# Patient Record
Sex: Female | Born: 1960 | Race: Black or African American | Hispanic: No | Marital: Married | Smoking: Never smoker
Health system: Southern US, Community
[De-identification: ages and names within clinical notes are randomized; demographics above are authoritative.]

## PROBLEM LIST (undated history)

## (undated) DIAGNOSIS — J45909 Unspecified asthma, uncomplicated: Secondary | ICD-10-CM

## (undated) DIAGNOSIS — E049 Nontoxic goiter, unspecified: Secondary | ICD-10-CM

## (undated) DIAGNOSIS — E119 Type 2 diabetes mellitus without complications: Secondary | ICD-10-CM

## (undated) DIAGNOSIS — I10 Essential (primary) hypertension: Secondary | ICD-10-CM

## (undated) DIAGNOSIS — E039 Hypothyroidism, unspecified: Secondary | ICD-10-CM

## (undated) HISTORY — DX: Hypothyroidism, unspecified: E03.9

## (undated) HISTORY — PX: OTHER SURGICAL HISTORY: SHX169

## (undated) HISTORY — DX: Nontoxic goiter, unspecified: E04.9

---

## 2015-12-16 LAB — TSH: TSH: 3.1 u[IU]/mL (ref <=5.90)

## 2016-08-17 LAB — LIPID PANEL
CHOLESTEROL: 210 mg/dL — AB (ref 0–200)
HDL: 39 mg/dL (ref 35–70)
LDL CALC: 133 mg/dL
TRIGLYCERIDES: 190 mg/dL — AB (ref 40–160)

## 2016-08-17 LAB — HEMOGLOBIN A1C: HEMOGLOBIN A1C: 7.1

## 2016-11-28 ENCOUNTER — Ambulatory Visit: Payer: Self-pay | Admitting: "Endocrinology

## 2016-12-07 ENCOUNTER — Encounter: Payer: Self-pay | Admitting: "Endocrinology

## 2016-12-07 ENCOUNTER — Ambulatory Visit (INDEPENDENT_AMBULATORY_CARE_PROVIDER_SITE_OTHER): Payer: BLUE CROSS/BLUE SHIELD | Admitting: "Endocrinology

## 2016-12-07 VITALS — BP 149/84 | HR 76 | Ht 67.0 in | Wt 242.0 lb

## 2016-12-07 DIAGNOSIS — I10 Essential (primary) hypertension: Secondary | ICD-10-CM | POA: Insufficient documentation

## 2016-12-07 DIAGNOSIS — E6609 Other obesity due to excess calories: Secondary | ICD-10-CM | POA: Diagnosis not present

## 2016-12-07 DIAGNOSIS — E118 Type 2 diabetes mellitus with unspecified complications: Secondary | ICD-10-CM | POA: Diagnosis not present

## 2016-12-07 DIAGNOSIS — E039 Hypothyroidism, unspecified: Secondary | ICD-10-CM | POA: Insufficient documentation

## 2016-12-07 DIAGNOSIS — E049 Nontoxic goiter, unspecified: Secondary | ICD-10-CM | POA: Insufficient documentation

## 2016-12-07 DIAGNOSIS — E1165 Type 2 diabetes mellitus with hyperglycemia: Secondary | ICD-10-CM | POA: Diagnosis not present

## 2016-12-07 DIAGNOSIS — Z6837 Body mass index (BMI) 37.0-37.9, adult: Secondary | ICD-10-CM | POA: Diagnosis not present

## 2016-12-07 DIAGNOSIS — IMO0001 Reserved for inherently not codable concepts without codable children: Secondary | ICD-10-CM | POA: Insufficient documentation

## 2016-12-07 DIAGNOSIS — IMO0002 Reserved for concepts with insufficient information to code with codable children: Secondary | ICD-10-CM

## 2016-12-07 MED ORDER — LEVOTHYROXINE SODIUM 50 MCG PO TABS: 50.0000 ug | ORAL_TABLET | Freq: Every day | ORAL | 2 refills | Status: DC

## 2016-12-07 MED ORDER — METFORMIN HCL 500 MG PO TABS: 500.0000 mg | ORAL_TABLET | Freq: Two times a day (BID) | ORAL | 3 refills | Status: DC

## 2016-12-07 NOTE — Patient Instructions (Signed)

## 2016-12-07 NOTE — Progress Notes (Signed)
Subjective:    Patient ID: Andrey SpearmanAlice Chew, female    DOB: 05/17/1961, PCP Barbie HaggisWILBORNE, CYNTHIA, FNP   Past Medical History:  Diagnosis Date  . Goiter   . Hypothyroidism    Past Surgical History:  Procedure Laterality Date  . Partial Thyroidiectomy     Social History   Social History  . Marital status: Married    Spouse name: N/A  . Number of children: N/A  . Years of education: N/A   Social History Main Topics  . Smoking status: Never Smoker  . Smokeless tobacco: Never Used  . Alcohol use No  . Drug use: No  . Sexual activity: Not Asked   Other Topics Concern  . None   Social History Narrative  . None   Outpatient Encounter Prescriptions as of 12/07/2016  Medication Sig  . levothyroxine (SYNTHROID, LEVOTHROID) 50 MCG tablet Take 1 tablet (50 mcg total) by mouth daily before breakfast.  . Omega-3 Fatty Acids (FISH OIL) 1000 MG CAPS Take by mouth daily.  . Red Yeast Rice Extract (RED YEAST RICE PO) Take by mouth daily.  . [DISCONTINUED] levothyroxine (SYNTHROID, LEVOTHROID) 25 MCG tablet Take 25 mcg by mouth daily before breakfast.  . metFORMIN (GLUCOPHAGE) 500 MG tablet Take 1 tablet (500 mg total) by mouth 2 (two) times daily with a meal.   No facility-administered encounter medications on file as of 12/07/2016.    ALLERGIES: Allergies  Allergen Reactions  . Penicillins   . Sugar-Protein-Starch     VACCINATION STATUS:  There is no immunization history on file for this patient.  HPI 56 year old female patient with medical history as above. She is being seen in consultation for hypothyroidism requested by Barbie HaggisWILBORNE, CYNTHIA, FNP. - Her history includes partial left hemithyroidectomy in 2000 for nodular goiter. Per her report, she did not require any thyroid hormone supplement until last year when she was initiated on low-dose levothyroxine 25 g by mouth every morning. -She reports compliance to this medication. - Currently she complains of progressive weight  gain/inability to lose weight, fatigue. - She also complains of some pressure in her neck, but denies dysphagia or shortness of breath. - She also has type 2 diabetes with recent A1c was 7.1%, not on medications. -She was found to have significantly elevated blood pressure at 149/84 , she is not on medications. 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 She is taking Red Yeast Rice from over-the-counter.  Review of Systems  Constitutional: Significantly over weight, progressively gaining, + fatigue, no subjective hyperthermia, no subjective hypothermia Eyes: no blurry vision, no xerophthalmia ENT: no sore throat, no nodules palpated in throat, no dysphagia/odynophagia, no hoarseness Cardiovascular: no Chest Pain, no Shortness of Breath, no palpitations, no leg swelling Respiratory: no cough, no SOB Gastrointestinal: no Nausea/Vomiting/Diarhhea Musculoskeletal: no muscle/joint aches Skin: no rashes Neurological: no tremors, no numbness, no tingling, no dizziness Psychiatric: no depression, no anxiety  Objective:    BP (!) 149/84   Pulse 76   Ht 5\' 7"  (1.702 m)   Wt 242 lb (109.8 kg)   BMI 37.90 kg/m   Wt Readings from Last 3 Encounters:  12/07/16 242 lb (109.8 kg)    Physical Exam  Constitutional: Significantly over weight for hight, not in acute distress, normal state of mind Eyes: PERRLA, EOMI, no exophthalmos ENT: moist mucous membranes, +  palpable asymmetric thyromegaly R>L, no cervical lymphadenopathy Cardiovascular: normal precordial activity, Regular Rate and Rhythm, no Murmur/Rubs/Gallops Respiratory:  adequate breathing efforts,  no gross chest deformity, Clear to auscultation bilaterally Gastrointestinal: +Obese,  abdomen soft, Non -tender, No distension, Bowel Sounds present Musculoskeletal: no gross deformities, strength intact in all four extremities Skin: moist, warm, no rashes Neurological: no tremor with outstretched hands,  Deep tendon reflexes normal in all four extremities.  Referral package includes labs from 08/17/2016: A1c 7.1%, TSH 3.1, free T4 0.78, total cholesterol 210, triglycerides 190, HDL 39, LDL 133   Assessment & Plan:   1. Hypothyroidism, unspecified type - Based on her body habitus and thyroid function tests from November 2017, she will benefit from slight increase in her thyroid hormone. - I will increase her levothyroxine to 50 g by mouth every morning, to advance based on her response and thyroid function test.  - We discussed about correct intake of levothyroxine, at fasting, with water, separated by at least 30 minutes from breakfast, and separated by more than 4 hours from calcium, iron, multivitamins, acid reflux medications (PPIs). -Patient is made aware of the fact that thyroid hormone replacement is needed for life, dose to be adjusted by periodic monitoring of thyroid function tests. - I would include free T4/TSH, thyroglobulin antibodies and thyroid peroxidase antibodies during her next lab work.   2. Uncontrolled type 2 diabetes mellitus with complication, without long-term current use of insulin (HCC) - This patient has significant metabolic syndrome including type 2 diabetes, dyslipidemia, hypertension, obesity, relatively sedentary life. -  She will benefit from weight loss. - I had a long discussion with her regarding the need for control of diabetes mainly by diet and exercise and the fact that she would benefit from low-dose metformin. She agrees to plan to initiate metformin 500 mg by mouth daily after breakfast. - I will include hemoglobin A1c during her next labs.   3. Nodular goiter - Physical exam is significant for asymmetrical a large thyroid right lobe  greater than left lobe. She will need baseline thyroid/neck ultrasound. If she has significant findings of nodules, she'll be considered for fine needle aspiration.  4. Hypertension: It appears that she had it for  sometime .  she is hesitant to go on medications. I advised her on salt restriction, needs follow-up.    5. Hyperlipidemia: She is hesitant to treat hyperlipidemia with statins and would like to stay on red yeast Rice.  - 25 minutes of time was spent on the care of this patient , 50% of which was applied for counseling on diabetes complications and their preventions.  - I advised patient to maintain close follow up with Barbie Haggis, FNP for primary care needs. Follow up plan: Return in about 3 months (around 03/09/2017) for follow up with pre-visit labs, Thyroid / Neck Ultrasound.  Marquis Lunch, MD Phone: [phone]  Fax: [phone]   12/07/2016, 4:19 PM

## 2017-03-02 ENCOUNTER — Other Ambulatory Visit: Payer: Self-pay | Admitting: "Endocrinology

## 2017-03-20 ENCOUNTER — Other Ambulatory Visit: Payer: Self-pay | Admitting: "Endocrinology

## 2017-03-20 LAB — COMPREHENSIVE METABOLIC PANEL
ALK PHOS: 51 U/L (ref 33–130)
ALT: 14 U/L (ref 6–29)
AST: 20 U/L (ref 10–35)
Albumin: 4.2 g/dL (ref 3.6–5.1)
BUN: 13 mg/dL (ref 7–25)
CALCIUM: 9.5 mg/dL (ref 8.6–10.4)
CHLORIDE: 103 mmol/L (ref 98–110)
CO2: 26 mmol/L (ref 20–31)
Creat: 0.87 mg/dL (ref 0.50–1.05)
GLUCOSE: 89 mg/dL (ref 65–99)
POTASSIUM: 4.4 mmol/L (ref 3.5–5.3)
Sodium: 139 mmol/L (ref 135–146)
TOTAL PROTEIN: 6.9 g/dL (ref 6.1–8.1)
Total Bilirubin: 0.4 mg/dL (ref 0.2–1.2)

## 2017-03-20 LAB — TSH: TSH: 1.12 m[IU]/L

## 2017-03-20 LAB — T4, FREE: Free T4: 1.3 ng/dL (ref 0.8–1.8)

## 2017-03-21 LAB — HEMOGLOBIN A1C
HEMOGLOBIN A1C: 5.8 % — AB (ref <5.7)
MEAN PLASMA GLUCOSE: 120 mg/dL

## 2017-03-21 LAB — THYROID PEROXIDASE ANTIBODY: THYROID PEROXIDASE ANTIBODY: 1 [IU]/mL (ref <9)

## 2017-03-21 LAB — THYROGLOBULIN ANTIBODY: Thyroglobulin Ab: <1 IU/mL (ref <2)

## 2017-03-23 ENCOUNTER — Ambulatory Visit (HOSPITAL_COMMUNITY)
Admission: RE | Admit: 2017-03-23 | Discharge: 2017-03-23 | Disposition: A | Discharge status: Home / Self Care | Payer: BLUE CROSS/BLUE SHIELD | Source: Ambulatory Visit | Attending: "Endocrinology | Admitting: "Endocrinology

## 2017-03-23 DIAGNOSIS — Z9889 Other specified postprocedural states: Secondary | ICD-10-CM | POA: Insufficient documentation

## 2017-03-23 DIAGNOSIS — E039 Hypothyroidism, unspecified: Secondary | ICD-10-CM | POA: Diagnosis not present

## 2017-03-23 DIAGNOSIS — E049 Nontoxic goiter, unspecified: Secondary | ICD-10-CM

## 2017-03-27 ENCOUNTER — Encounter: Payer: Self-pay | Admitting: "Endocrinology

## 2017-03-27 ENCOUNTER — Ambulatory Visit (INDEPENDENT_AMBULATORY_CARE_PROVIDER_SITE_OTHER): Payer: BLUE CROSS/BLUE SHIELD | Admitting: "Endocrinology

## 2017-03-27 VITALS — BP 132/80 | HR 64 | Ht 67.0 in | Wt 219.0 lb

## 2017-03-27 DIAGNOSIS — I10 Essential (primary) hypertension: Secondary | ICD-10-CM | POA: Diagnosis not present

## 2017-03-27 DIAGNOSIS — E6609 Other obesity due to excess calories: Secondary | ICD-10-CM | POA: Diagnosis not present

## 2017-03-27 DIAGNOSIS — Z6837 Body mass index (BMI) 37.0-37.9, adult: Secondary | ICD-10-CM | POA: Diagnosis not present

## 2017-03-27 DIAGNOSIS — E039 Hypothyroidism, unspecified: Secondary | ICD-10-CM | POA: Diagnosis not present

## 2017-03-27 DIAGNOSIS — E118 Type 2 diabetes mellitus with unspecified complications: Secondary | ICD-10-CM

## 2017-03-27 DIAGNOSIS — IMO0002 Reserved for concepts with insufficient information to code with codable children: Secondary | ICD-10-CM

## 2017-03-27 DIAGNOSIS — E1165 Type 2 diabetes mellitus with hyperglycemia: Secondary | ICD-10-CM | POA: Diagnosis not present

## 2017-03-27 DIAGNOSIS — E049 Nontoxic goiter, unspecified: Secondary | ICD-10-CM | POA: Diagnosis not present

## 2017-03-27 DIAGNOSIS — IMO0001 Reserved for inherently not codable concepts without codable children: Secondary | ICD-10-CM

## 2017-03-27 NOTE — Progress Notes (Signed)
Subjective:    Patient ID: Denise SpearmanAlice Oneill, female    DOB: [birth year], PCP Barbie HaggisWilborne, Cynthia, FNP   Past Medical History:  Diagnosis Date  . Goiter   . Hypothyroidism    Past Surgical History:  Procedure Laterality Date  . Partial Thyroidiectomy     Social History   Social History  . Marital status: Married    Spouse name: N/A  . Number of children: N/A  . Years of education: N/A   Social History Main Topics  . Smoking status: Never Smoker  . Smokeless tobacco: Never Used  . Alcohol use No  . Drug use: No  . Sexual activity: Not Asked   Other Topics Concern  . None   Social History Narrative  . None   Outpatient Encounter Prescriptions as of 03/27/2017  Medication Sig  . levothyroxine (SYNTHROID, LEVOTHROID) 50 MCG tablet TAKE 1 TABLET BY MOUTH DAILY BEFORE BREAKFAST  . metFORMIN (GLUCOPHAGE) 500 MG tablet Take 1 tablet (500 mg total) by mouth 2 (two) times daily with a meal.  . Omega-3 Fatty Acids (FISH OIL) 1000 MG CAPS Take by mouth daily.  . Red Yeast Rice Extract (RED YEAST RICE PO) Take by mouth daily.   No facility-administered encounter medications on file as of 03/27/2017.    ALLERGIES: Allergies  Allergen Reactions  . Penicillins   . Sugar-Protein-Starch     VACCINATION STATUS:  There is no immunization history on file for this patient.  HPI 56 year old female patient with medical history as above. She is being seen in f/u  for hypothyroidism requested by Barbie HaggisWILBORNE, CYNTHIA, FNP. - Her history includes partial left hemithyroidectomy in 2000 for nodular goiter. Per her report, she did not require any thyroid hormone supplement until last year when she was initiated on low-dose levothyroxine, currently at  50 g by mouth every morning. -She reports compliance to this medication. - Currently she complains of progressive weight gain/inability to lose weight, fatigue. - She also complains of some pressure in her neck, but denies dysphagia or shortness of  breath. - She also has type 2 diabetes with recent A1c of 5.8% from 7.1%,  on metformin 500mg  po qday.  - She has  current dyslipidemia including LDL 133, not on medication. She is taking Red Yeast Rice from over-the-counter.  Review of Systems  Constitutional: Significantly over weight, progressively gaining, + fatigue, no subjective hyperthermia, no subjective hypothermia Eyes: no blurry vision, no xerophthalmia ENT: no sore throat, no nodules palpated in throat, no dysphagia/odynophagia, no hoarseness Cardiovascular: no Chest Pain, no Shortness of Breath, no palpitations, no leg swelling Respiratory: no cough, no SOB Gastrointestinal: no Nausea/Vomiting/Diarhhea Musculoskeletal: no muscle/joint aches Skin: no rashes Neurological: no tremors, no numbness, no tingling, no dizziness Psychiatric: no depression, no anxiety  Objective:    BP 132/80   Pulse 64   Ht 5\' 7"  (1.702 m)   Wt 219 lb (99.3 kg)   BMI 34.30 kg/m   Wt Readings from Last 3 Encounters:  03/27/17 219 lb (99.3 kg)  12/07/16 242 lb (109.8 kg)    Physical Exam  Constitutional: Significantly over weight for hight, not in acute distress, normal state of mind Eyes: PERRLA, EOMI, no exophthalmos ENT: moist mucous membranes, +  palpable asymmetric thyromegaly R>L, no cervical lymphadenopathy Cardiovascular: normal precordial activity, Regular Rate and Rhythm, no Murmur/Rubs/Gallops Respiratory:  adequate breathing efforts, no gross chest deformity, Clear to auscultation bilaterally Gastrointestinal: +Obese,  abdomen soft, Non -tender, No distension, Bowel Sounds present Musculoskeletal: no  gross deformities, strength intact in all four extremities Skin: moist, warm, no rashes Neurological: no tremor with outstretched hands, Deep tendon reflexes normal in all four extremities.  Recent Results (from the past 2160 hour(s))  Comprehensive metabolic panel     Status: None   Collection Time: 03/20/17  9:16 AM  Result  Value Ref Range   Sodium 139 135 - 146 mmol/L   Potassium 4.4 3.5 - 5.3 mmol/L   Chloride 103 98 - 110 mmol/L   CO2 26 20 - 31 mmol/L   Glucose, Bld 89 65 - 99 mg/dL   BUN 13 7 - 25 mg/dL   Creat 1.91 4.78 - 2.95 mg/dL    Comment:   For patients > or = 57 years of age: The upper reference limit for Creatinine is approximately 13% higher for people identified as African-American.      Total Bilirubin 0.4 0.2 - 1.2 mg/dL   Alkaline Phosphatase 51 33 - 130 U/L   AST 20 10 - 35 U/L   ALT 14 6 - 29 U/L   Total Protein 6.9 6.1 - 8.1 g/dL   Albumin 4.2 3.6 - 5.1 g/dL   Calcium 9.5 8.6 - 62.1 mg/dL  TSH     Status: None   Collection Time: 03/20/17  9:16 AM  Result Value Ref Range   TSH 1.12 mIU/L    Comment:   Reference Range   > or = 20 Years  0.40-4.50   Pregnancy Range First trimester  0.26-2.66 Second trimester 0.55-2.73 Third trimester  0.43-2.91     T4, free     Status: None   Collection Time: 03/20/17  9:16 AM  Result Value Ref Range   Free T4 1.3 0.8 - 1.8 ng/dL  Hemoglobin H0Q     Status: Abnormal   Collection Time: 03/20/17  9:16 AM  Result Value Ref Range   Hgb A1c MFr Bld 5.8 (H) <5.7 %    Comment:   For someone without known diabetes, a hemoglobin A1c value between 5.7% and 6.4% is consistent with prediabetes and should be confirmed with a follow-up test.   For someone with known diabetes, a value <7% indicates that their diabetes is well controlled. A1c targets should be individualized based on duration of diabetes, age, co-morbid conditions and other considerations.   This assay result is consistent with an increased risk of diabetes.   Currently, no consensus exists regarding use of hemoglobin A1c for diagnosis of diabetes in children.      Mean Plasma Glucose 120 mg/dL  Thyroid peroxidase antibody     Status: None   Collection Time: 03/20/17  9:16 AM  Result Value Ref Range   Thyroperoxidase Ab SerPl-aCnc 1 <9 IU/mL  Thyroglobulin antibody      Status: None   Collection Time: 03/20/17  9:16 AM  Result Value Ref Range   Thyroglobulin Ab <1 <2 IU/mL       Assessment & Plan:   1. Hypothyroidism, unspecified type - Based on her thyroid function tests, levothyroxine 50 g is the right dose for now.   - We discussed about correct intake of levothyroxine, at fasting, with water, separated by at least 30 minutes from breakfast, and separated by more than 4 hours from calcium, iron, multivitamins, acid reflux medications (PPIs). -Patient is made aware of the fact that thyroid hormone replacement is needed for life, dose to be adjusted by periodic monitoring of thyroid function tests.   2. Uncontrolled type 2 diabetes mellitus with  complication, without long-term current use of insulin (HCC) - This patient has significant metabolic syndrome including type 2 diabetes, dyslipidemia, hypertension, obesity, relatively sedentary life. -  She has lost 23 pounds since last visit him a A1c improved to 5.8% from 7.1%. - I had a long discussion with her regarding the need for control of diabetes mainly by diet and exercise and the fact that she would benefit from low-dose metformin. She agrees to plan to initiate metformin 500 mg by mouth daily after breakfast. - I will include hemoglobin A1c during her next labs.   3. Nodular goiter - Thyroid/neck ultrasound showed surgically absent left lobe and isthmus, right lobe 5.7 centimeters slightly enlarged. She has no discrete nodules.  -She will not require any intervention at this time.  4. Hypertension: It appears that she had it for sometime . It has improved as a result of her significant weight loss.  she is hesitant to go on medications. I advised her on salt restriction, needs follow-up.    5. Hyperlipidemia: She is hesitant to treat hyperlipidemia with statins and would like to stay on red yeast Rice.  - 25 minutes of time was spent on the care of this patient , 50% of which was applied  for counseling on diabetes complications and their preventions.  - I advised patient to maintain close follow up with Barbie Haggis, FNP for primary care needs. Follow up plan: Return in about 6 months (around 09/27/2017) for follow up with pre-visit labs.  Marquis Lunch, MD Phone: [phone]  Fax: [phone]   03/27/2017, 4:12 PM

## 2017-04-01 ENCOUNTER — Other Ambulatory Visit: Payer: Self-pay | Admitting: "Endocrinology

## 2017-06-01 ENCOUNTER — Other Ambulatory Visit: Payer: Self-pay | Admitting: "Endocrinology

## 2017-08-01 ENCOUNTER — Encounter (INDEPENDENT_AMBULATORY_CARE_PROVIDER_SITE_OTHER): Payer: Self-pay | Admitting: *Deleted

## 2017-08-01 ENCOUNTER — Other Ambulatory Visit: Payer: Self-pay | Admitting: "Endocrinology

## 2017-08-30 ENCOUNTER — Other Ambulatory Visit: Payer: Self-pay | Admitting: "Endocrinology

## 2017-09-21 ENCOUNTER — Encounter (INDEPENDENT_AMBULATORY_CARE_PROVIDER_SITE_OTHER): Payer: Self-pay | Admitting: *Deleted

## 2017-09-22 ENCOUNTER — Other Ambulatory Visit (INDEPENDENT_AMBULATORY_CARE_PROVIDER_SITE_OTHER): Payer: Self-pay | Admitting: *Deleted

## 2017-09-22 DIAGNOSIS — Z1211 Encounter for screening for malignant neoplasm of colon: Secondary | ICD-10-CM

## 2017-10-18 ENCOUNTER — Other Ambulatory Visit: Payer: Self-pay | Admitting: "Endocrinology

## 2017-10-18 DIAGNOSIS — R739 Hyperglycemia, unspecified: Secondary | ICD-10-CM

## 2017-10-18 DIAGNOSIS — E039 Hypothyroidism, unspecified: Secondary | ICD-10-CM

## 2017-10-19 LAB — RENAL FUNCTION PANEL
Albumin: 4.3 g/dL (ref 3.6–5.1)
BUN: 10 mg/dL (ref 7–25)
CHLORIDE: 101 mmol/L (ref 98–110)
CO2: 28 mmol/L (ref 20–32)
CREATININE: 0.88 mg/dL (ref 0.50–1.05)
Calcium: 10.1 mg/dL (ref 8.6–10.4)
Glucose, Bld: 102 mg/dL (ref 65–139)
PHOSPHORUS: 3.3 mg/dL (ref 2.5–4.5)
Potassium: 4.4 mmol/L (ref 3.5–5.3)
Sodium: 138 mmol/L (ref 135–146)

## 2017-10-19 LAB — TSH: TSH: 0.75 mIU/L (ref 0.40–4.50)

## 2017-10-19 LAB — T4, FREE: FREE T4: 1.1 ng/dL (ref 0.8–1.8)

## 2017-10-25 ENCOUNTER — Ambulatory Visit (INDEPENDENT_AMBULATORY_CARE_PROVIDER_SITE_OTHER): Payer: BLUE CROSS/BLUE SHIELD | Admitting: "Endocrinology

## 2017-10-25 ENCOUNTER — Encounter: Payer: Self-pay | Admitting: "Endocrinology

## 2017-10-25 VITALS — BP 161/105 | HR 71 | Ht 67.0 in | Wt 215.0 lb

## 2017-10-25 DIAGNOSIS — E1165 Type 2 diabetes mellitus with hyperglycemia: Secondary | ICD-10-CM

## 2017-10-25 DIAGNOSIS — E118 Type 2 diabetes mellitus with unspecified complications: Secondary | ICD-10-CM

## 2017-10-25 DIAGNOSIS — I10 Essential (primary) hypertension: Secondary | ICD-10-CM | POA: Diagnosis not present

## 2017-10-25 DIAGNOSIS — E039 Hypothyroidism, unspecified: Secondary | ICD-10-CM | POA: Diagnosis not present

## 2017-10-25 DIAGNOSIS — IMO0002 Reserved for concepts with insufficient information to code with codable children: Secondary | ICD-10-CM

## 2017-10-25 MED ORDER — LEVOTHYROXINE SODIUM 75 MCG PO TABS: 75.0000 ug | ORAL_TABLET | Freq: Every day | ORAL | 6 refills | Status: DC

## 2017-10-25 MED ORDER — LISINOPRIL-HYDROCHLOROTHIAZIDE 10-12.5 MG PO TABS
1.0000 | ORAL_TABLET | Freq: Every day | ORAL | 3 refills | Status: DC
Start: 2017-10-25 — End: 2018-02-17

## 2017-10-25 NOTE — Patient Instructions (Signed)

## 2017-10-25 NOTE — Progress Notes (Signed)
Subjective:    Patient ID: Denise Oneill, female    DOB: 1961/08/28, PCP Garald BraverElliott, Dianne E   Past Medical History:  Diagnosis Date  . Goiter   . Hypothyroidism    Past Surgical History:  Procedure Laterality Date  . Partial Thyroidiectomy     Social History   Socioeconomic History  . Marital status: Married    Spouse name: None  . Number of children: None  . Years of education: None  . Highest education level: None  Social Needs  . Financial resource strain: None  . Food insecurity - worry: None  . Food insecurity - inability: None  . Transportation needs - medical: None  . Transportation needs - non-medical: None  Occupational History  . None  Tobacco Use  . Smoking status: Never Smoker  . Smokeless tobacco: Never Used  Substance and Sexual Activity  . Alcohol use: No  . Drug use: No  . Sexual activity: None  Other Topics Concern  . None  Social History Narrative  . None   Outpatient Encounter Medications as of 10/25/2017  Medication Sig  . levothyroxine (SYNTHROID, LEVOTHROID) 75 MCG tablet Take 1 tablet (75 mcg total) by mouth daily before breakfast.  . lisinopril-hydrochlorothiazide (PRINZIDE,ZESTORETIC) 10-12.5 MG tablet Take 1 tablet by mouth daily.  . metFORMIN (GLUCOPHAGE) 500 MG tablet Take 1 tablet (500 mg total) daily with breakfast by mouth.  . Omega-3 Fatty Acids (FISH OIL) 1000 MG CAPS Take by mouth daily.  . Red Yeast Rice Extract (RED YEAST RICE PO) Take by mouth daily.  . [DISCONTINUED] levothyroxine (SYNTHROID, LEVOTHROID) 50 MCG tablet TAKE 1 TABLET BY MOUTH DAILY BEFORE BREAKFAST   No facility-administered encounter medications on file as of 10/25/2017.    ALLERGIES: Allergies  Allergen Reactions  . Penicillins   . Sugar-Protein-Starch     VACCINATION STATUS:  There is no immunization history on file for this patient.  HPI 57 year old female patient with medical history as above. She is being seen in follow-up for hypothyroidism ,  type 2 diabetes, hypertension .  - Her history includes partial left hemithyroidectomy in 2000 for nodular goiter. Per her report, she did not require any thyroid hormone supplement until 2017,  when she was initiated on low-dose levothyroxine, currently at  50 g by mouth every morning. -She reports compliance to this medication. -She kept the weight loss she achieved prior to her last visit, she has no new complaints today.    - She also complains of some pressure in her neck, but denies dysphagia or shortness of breath. - She also has type 2 diabetes with recent A1c of 5.8% from 7.1%,  on metformin 500mg  po qday.  - She has  current dyslipidemia including LDL 133, not on medication. She is taking Red Yeast Rice from over-the-counter.  Review of Systems  Constitutional: Steady weight, fatigue, no subjective hyperthermia, no subjective hypothermia Eyes: no blurry vision, no xerophthalmia ENT: no sore throat, no nodules palpated in throat, no dysphagia/odynophagia, no hoarseness Cardiovascular: No chest pain, no shortness of breath, no palpitations.   Respiratory: no cough, no SOB Gastrointestinal: no Nausea/Vomiting/Diarhhea Musculoskeletal: no muscle/joint aches Skin: no rashes Neurological: no tremors, no numbness, no tingling, no dizziness Psychiatric: no depression, no anxiety  Objective:    BP (!) 161/105   Pulse 71   Ht 5\' 7"  (1.702 m)   Wt 215 lb (97.5 kg)   BMI 33.67 kg/m   Wt Readings from Last 3 Encounters:  10/25/17 215 lb (  97.5 kg)  03/27/17 219 lb (99.3 kg)  12/07/16 242 lb (109.8 kg)    Physical Exam  Constitutional:  + Obese (losing weight) , not in acute distress, normal state of mind Eyes: PERRLA, EOMI, no exophthalmos ENT: moist mucous membranes, +  palpable asymmetric thyromegaly R>L, no cervical lymphadenopathy Cardiovascular: normal precordial activity, Regular Rate and Rhythm, no Murmur/Rubs/Gallops Respiratory:  adequate breathing efforts, no gross  chest deformity, Clear to auscultation bilaterally Gastrointestinal: +Obese,  abdomen soft, Non -tender, No distension, Bowel Sounds present Musculoskeletal: no gross deformities, strength intact in all four extremities Skin: moist, warm, no rashes Neurological: no tremor with outstretched hands, Deep tendon reflexes normal in all four extremities.  Recent Results (from the past 2160 hour(s))  Renal function panel     Status: None   Collection Time: 10/18/17  4:11 PM  Result Value Ref Range   Glucose, Bld 102 65 - 139 mg/dL    Comment: .        Non-fasting reference interval .    BUN 10 7 - 25 mg/dL   Creat 1.61 0.96 - 0.45 mg/dL    Comment: For patients >24 years of age, the reference limit for Creatinine is approximately 13% higher for people identified as African-American. .    BUN/Creatinine Ratio NOT APPLICABLE 6 - 22 (calc)   Sodium 138 135 - 146 mmol/L   Potassium 4.4 3.5 - 5.3 mmol/L   Chloride 101 98 - 110 mmol/L   CO2 28 20 - 32 mmol/L   Calcium 10.1 8.6 - 10.4 mg/dL   Phosphorus 3.3 2.5 - 4.5 mg/dL   Albumin 4.3 3.6 - 5.1 g/dL  TSH     Status: None   Collection Time: 10/18/17  4:11 PM  Result Value Ref Range   TSH 0.75 0.40 - 4.50 mIU/L  T4, free     Status: None   Collection Time: 10/18/17  4:11 PM  Result Value Ref Range   Free T4 1.1 0.8 - 1.8 ng/dL       Assessment & Plan:   1. Hypothyroidism, postsurgical - Based on her thyroid function tests, she will benefit from slight increase in her levothyroxine.  -I discussed and increase her levothyroxine to 75 mcg p.o. every morning.   - We discussed about correct intake of levothyroxine, at fasting, with water, separated by at least 30 minutes from breakfast, and separated by more than 4 hours from calcium, iron, multivitamins, acid reflux medications (PPIs). -Patient is made aware of the fact that thyroid hormone replacement is needed for life, dose to be adjusted by periodic monitoring of thyroid function  tests.   2. Uncontrolled type 2 diabetes mellitus with complication, without long-term current use of insulin (HCC) - This patient has significant metabolic syndrome including type 2 diabetes, dyslipidemia, hypertension, obesity, relatively sedentary life. -  She has lost 27 pounds overall, her last A1c was 5.8%.  - I had a long discussion with her regarding the need for control of diabetes mainly by diet and exercise . -  Suggestion is made for her to avoid simple carbohydrates  from her diet including Cakes, Sweet Desserts / Pastries, Ice Cream, Soda (diet and regular), Sweet Tea, Candies, Chips, Cookies, Store Bought Juices, Alcohol in Excess of  1-2 drinks a day, Artificial Sweeteners, and "Sugar-free" Products. This will help patient to have stable blood glucose profile and potentially avoid unintended weight gain.  - I will include hemoglobin A1c during her next labs.   3. Nodular goiter -  Thyroid/neck ultrasound showed surgically absent left lobe and isthmus, right lobe 5.7 centimeters slightly enlarged. She has no discrete nodules.  -She will not require any intervention at this time.  4. Hypertension: Uncontrolled at 161/105 it appears that she had it for sometime , and has always been hesitant to take medications.  -She reluctantly accepts a prescription for lisinopril/HCTZ 10/12.5 mg p.o. daily.  I advised her on salt restriction, needs follow-up.    5. Hyperlipidemia: She is hesitant to treat hyperlipidemia with statins and would like to stay on red yeast Rice.  - Time spent with the patient: 25 min, of which >50% was spent in reviewing her  current and  previous labs, previous treatments, and medications  doses and developing a plan for long-term care.    - I advised patient to maintain close follow up with Richarda Blade E for primary care needs. Follow up plan: Return in about 3 months (around 01/22/2018) for follow up with pre-visit labs.  Marquis Lunch, MD Phone:  [phone]  Fax: [phone]  -  This note was partially dictated with voice recognition software. Similar sounding words can be transcribed inadequately or may not  be corrected upon review.  10/25/2017, 4:28 PM

## 2017-11-21 ENCOUNTER — Other Ambulatory Visit: Payer: Self-pay | Admitting: "Endocrinology

## 2017-12-18 ENCOUNTER — Telehealth (INDEPENDENT_AMBULATORY_CARE_PROVIDER_SITE_OTHER): Payer: Self-pay | Admitting: *Deleted

## 2017-12-18 ENCOUNTER — Encounter (INDEPENDENT_AMBULATORY_CARE_PROVIDER_SITE_OTHER): Payer: Self-pay | Admitting: *Deleted

## 2017-12-18 MED ORDER — PEG 3350-KCL-NA BICARB-NACL 420 G PO SOLR: 4000.0000 mL | Freq: Once | ORAL | 0 refills | Status: AC

## 2017-12-18 NOTE — Telephone Encounter (Signed)
Patient needs trilyte 

## 2018-01-11 ENCOUNTER — Telehealth (INDEPENDENT_AMBULATORY_CARE_PROVIDER_SITE_OTHER): Payer: Self-pay | Admitting: *Deleted

## 2018-01-11 NOTE — Telephone Encounter (Signed)
Referring MD/PCP: dianne elliott   Procedure: tcs  Reason/Indication:  screening  Has patient had this procedure before?  Yes, over 10 yrs ago  If so, when, by whom and where?    Is there a family history of colon cancer?  no  Who?  What age when diagnosed?    Is patient diabetic?   yes      Does patient have prosthetic heart valve or mechanical valve?  no  Do you have a pacemaker?  no  Has patient ever had endocarditis? no  Has patient had joint replacement within last 12 months?  no  Is patient constipated or do they take laxatives? no  Does patient have a history of alcohol/drug use?  no  Is patient on blood thinner such as Coumadin, Plavix and/or Aspirin? no  Medications: metformin 500 mg daily, levothyroxine 50 mcg daily  Allergies: pcn  Medication Adjustment per Dr Keane Policeehman/Terri Setzer, NP: don't take metformin morning of procedure  Procedure date & time: 02/07/18 at 830

## 2018-01-15 NOTE — Telephone Encounter (Signed)
agree

## 2018-01-16 LAB — COMPLETE METABOLIC PANEL WITH GFR
AG Ratio: 1.6 (calc) (ref 1.0–2.5)
ALBUMIN MSPROF: 4.4 g/dL (ref 3.6–5.1)
ALKALINE PHOSPHATASE (APISO): 51 U/L (ref 33–130)
ALT: 11 U/L (ref 6–29)
AST: 22 U/L (ref 10–35)
BILIRUBIN TOTAL: 0.6 mg/dL (ref 0.2–1.2)
BUN: 14 mg/dL (ref 7–25)
CHLORIDE: 102 mmol/L (ref 98–110)
CO2: 29 mmol/L (ref 20–32)
Calcium: 9.8 mg/dL (ref 8.6–10.4)
Creat: 0.85 mg/dL (ref 0.50–1.05)
GFR, Est African American: 88 mL/min/{1.73_m2} (ref >=60)
GFR, Est Non African American: 76 mL/min/{1.73_m2} (ref >=60)
GLOBULIN: 2.8 g/dL (ref 1.9–3.7)
GLUCOSE: 98 mg/dL (ref 65–99)
Potassium: 4.2 mmol/L (ref 3.5–5.3)
SODIUM: 138 mmol/L (ref 135–146)
Total Protein: 7.2 g/dL (ref 6.1–8.1)

## 2018-01-16 LAB — T4, FREE: FREE T4: 1.3 ng/dL (ref 0.8–1.8)

## 2018-01-16 LAB — TSH: TSH: 0.58 mIU/L (ref 0.40–4.50)

## 2018-01-17 LAB — HEMOGLOBIN A1C
EAG (MMOL/L): 6.6 (calc)
Hgb A1c MFr Bld: 5.8 % of total Hgb — ABNORMAL HIGH (ref <5.7)
Mean Plasma Glucose: 120 (calc)

## 2018-01-23 ENCOUNTER — Ambulatory Visit (INDEPENDENT_AMBULATORY_CARE_PROVIDER_SITE_OTHER): Payer: BLUE CROSS/BLUE SHIELD | Admitting: "Endocrinology

## 2018-01-23 ENCOUNTER — Encounter: Payer: Self-pay | Admitting: "Endocrinology

## 2018-01-23 VITALS — BP 131/85 | HR 90 | Ht 67.0 in | Wt 215.0 lb

## 2018-01-23 DIAGNOSIS — E039 Hypothyroidism, unspecified: Secondary | ICD-10-CM

## 2018-01-23 DIAGNOSIS — E782 Mixed hyperlipidemia: Secondary | ICD-10-CM | POA: Diagnosis not present

## 2018-01-23 DIAGNOSIS — I10 Essential (primary) hypertension: Secondary | ICD-10-CM | POA: Diagnosis not present

## 2018-01-23 NOTE — Progress Notes (Signed)
Subjective:    Patient ID: Denise Oneill, female    DOB: Jan 25, 1961, PCP Garald Braver   Past Medical History:  Diagnosis Date  . Goiter   . Hypothyroidism    Past Surgical History:  Procedure Laterality Date  . Partial Thyroidiectomy     Social History   Socioeconomic History  . Marital status: Married    Spouse name: Not on file  . Number of children: Not on file  . Years of education: Not on file  . Highest education level: Not on file  Occupational History  . Not on file  Social Needs  . Financial resource strain: Not on file  . Food insecurity:    Worry: Not on file    Inability: Not on file  . Transportation needs:    Medical: Not on file    Non-medical: Not on file  Tobacco Use  . Smoking status: Never Smoker  . Smokeless tobacco: Never Used  Substance and Sexual Activity  . Alcohol use: No  . Drug use: No  . Sexual activity: Not on file  Lifestyle  . Physical activity:    Days per week: Not on file    Minutes per session: Not on file  . Stress: Not on file  Relationships  . Social connections:    Talks on phone: Not on file    Gets together: Not on file    Attends religious service: Not on file    Active member of club or organization: Not on file    Attends meetings of clubs or organizations: Not on file    Relationship status: Not on file  Other Topics Concern  . Not on file  Social History Narrative  . Not on file   Outpatient Encounter Medications as of 01/23/2018  Medication Sig  . levothyroxine (SYNTHROID, LEVOTHROID) 75 MCG tablet Take 1 tablet (75 mcg total) by mouth daily before breakfast.  . lisinopril-hydrochlorothiazide (PRINZIDE,ZESTORETIC) 10-12.5 MG tablet Take 1 tablet by mouth daily.  . metFORMIN (GLUCOPHAGE) 500 MG tablet TAKE 1 TABLET (500 MG TOTAL) DAILY WITH BREAKFAST BY MOUTH.  . Omega-3 Fatty Acids (FISH OIL) 1000 MG CAPS Take by mouth daily.  . Red Yeast Rice Extract (RED YEAST RICE PO) Take by mouth daily.   No  facility-administered encounter medications on file as of 01/23/2018.    ALLERGIES: Allergies  Allergen Reactions  . Penicillins   . Sugar-Protein-Starch     VACCINATION STATUS:  There is no immunization history on file for this patient.  HPI 57 year old female patient with medical history as above. She is being seen in follow-up for hypothyroidism , type 2 diabetes, hypertension .  - Her history includes partial left hemithyroidectomy in 2000 for nodular goiter. Per her report, she did not require any thyroid hormone supplement until 2017,  when she was initiated on low-dose levothyroxine, currently at  75 g by mouth every morning. -She reports compliance to this medication. -She kept the weight loss she achieved prior to her last visit, she has no new complaints today.    - She also complains of some pressure in her neck, but denies dysphagia or shortness of breath. - She also has type 2 diabetes with recent A1c of 5.8% from 7.1%,  on metformin  po qday.  - She has  current dyslipidemia including LDL 133, not on medication. She is taking Red Yeast Rice from over-the-counter.  Review of Systems  Constitutional: Steady weight, fatigue, no subjective hyperthermia, no subjective hypothermia  Eyes: no blurry vision, no xerophthalmia ENT: no sore throat, no nodules palpated in throat, no dysphagia/odynophagia, no hoarseness Cardiovascular: No chest pain, no shortness of breath, no palpitations.   Respiratory: no cough, no SOB Gastrointestinal: no Nausea/Vomiting/Diarhhea Musculoskeletal: no muscle/joint aches Skin: no rashes Neurological: no tremors, no numbness, no tingling, no dizziness Psychiatric: no depression, no anxiety  Objective:    BP 131/85   Pulse 90   Ht  (1.702 m)   Wt 215 lb (97.5 kg)   BMI 33.67 kg/m   Wt Readings from Last 3 Encounters:  01/23/18 215 lb (97.5 kg)  10/25/17 215 lb (97.5 kg)  03/27/17 219 lb (99.3 kg)    Physical  Exam  Constitutional:  + Obese , not in acute distress, normal state of mind Eyes: PERRLA, EOMI, no exophthalmos ENT: moist mucous membranes, +  palpable asymmetric thyromegaly R>L, no cervical lymphadenopathy Cardiovascular: normal precordial activity, Regular Rate and Rhythm, no Murmur/Rubs/Gallops  Skin: moist, warm, no rashes Neurological: no tremor with outstretched hands, Deep tendon reflexes normal in all four extremities.  Recent Results (from the past 2160 hour(s))  COMPLETE METABOLIC PANEL WITH GFR     Status: None   Collection Time: 01/16/18 10:28 AM  Result Value Ref Range   Glucose, Bld 98 65 - 99 mg/dL    Comment: .            Fasting reference interval .    BUN 14 7 - 25 mg/dL   Creat 8.46 9.62 - 9.52 mg/dL    Comment: For patients >32 years of age, the reference limit for Creatinine is approximately 13% higher for people identified as African-American. .    GFR, Est Non African American 76 > OR = 60 mL/min/1.30m2   GFR, Est African American 88 > OR = 60 mL/min/1.47m2   BUN/Creatinine Ratio NOT APPLICABLE 6 - 22 (calc)   Sodium 138 135 - 146 mmol/L   Potassium 4.2 3.5 - 5.3 mmol/L   Chloride 102 98 - 110 mmol/L   CO2 29 20 - 32 mmol/L   Calcium 9.8 8.6 - 10.4 mg/dL   Total Protein 7.2 6.1 - 8.1 g/dL   Albumin 4.4 3.6 - 5.1 g/dL   Globulin 2.8 1.9 - 3.7 g/dL (calc)   AG Ratio 1.6 1.0 - 2.5 (calc)   Total Bilirubin 0.6 0.2 - 1.2 mg/dL   Alkaline phosphatase (APISO) 51 33 - 130 U/L   AST 22 10 - 35 U/L   ALT 11 6 - 29 U/L  Hemoglobin A1c     Status: Abnormal   Collection Time: 01/16/18 10:28 AM  Result Value Ref Range   Hgb A1c MFr Bld 5.8 (H) <5.7 % of total Hgb    Comment: For someone without known diabetes, a hemoglobin  A1c value between 5.7% and 6.4% is consistent with prediabetes and should be confirmed with a  follow-up test. . For someone with known diabetes, a value <7% indicates that their diabetes is well controlled. A1c targets should be  individualized based on duration of diabetes, age, comorbid conditions, and other considerations. . This assay result is consistent with an increased risk of diabetes. . Currently, no consensus exists regarding use of hemoglobin A1c for diagnosis of diabetes for children. .    Mean Plasma Glucose 120 (calc)   eAG (mmol/L) 6.6 (calc)  T4, free     Status: None   Collection Time: 01/16/18 10:28 AM  Result Value Ref Range   Free T4 1.3  0.8 - 1.8 ng/dL  TSH     Status: None   Collection Time: 01/16/18 10:28 AM  Result Value Ref Range   TSH 0.58 0.40 - 4.50 mIU/L      Assessment & Plan:   1. Hypothyroidism, postsurgical -Her thyroid function tests are consistent with appropriate replacement. -I advised her to stay on levothyroxine 75 mcg p.o. Nightly.  - We discussed about correct intake of levothyroxine, at fasting, with water, separated by at least 30 minutes from breakfast, and separated by more than 4 hours from calcium, iron, multivitamins, acid reflux medications (PPIs). -Patient is made aware of the fact that thyroid hormone replacement is needed for life, dose to be adjusted by periodic monitoring of thyroid function tests.    2. Controlled type 2 diabetes mellitus   - This patient has significant metabolic syndrome including type 2 diabetes, dyslipidemia, hypertension, obesity, relatively sedentary life. -  She has lost 32 pounds overall, her last A1c was 5.8%.  -She is advised to continue metformin 500 mg p.o. daily with breakfast. - I had a long discussion with her regarding the need for control of diabetes mainly by diet and exercise . -  Suggestion is made for her to avoid simple carbohydrates  from her diet including Cakes, Sweet Desserts / Pastries, Ice Cream, Soda (diet and regular), Sweet Tea, Candies, Chips, Cookies, Store Bought Juices, Alcohol in Excess of  1-2 drinks a day, Artificial Sweeteners, and "Sugar-free" Products. This will help patient to have stable  blood glucose profile and potentially avoid unintended weight gain.    3. Nodular goiter - Thyroid/neck ultrasound showed surgically absent left lobe and isthmus, right lobe 5.7 centimeters slightly enlarged. She has no discrete nodules.  -She will not require any intervention at this time.  4. Hypertension: Controlled and better than last visit.  She is advised to continue  lisinopril/HCTZ 10/12.5 mg p.o. daily.  I advised her on salt restriction, needs follow-up.    5. Hyperlipidemia: She is hesitant to treat hyperlipidemia with statins and would like to stay on red yeast Rice.  - I advised patient to maintain close follow up with Richarda Blade E for primary care needs. Follow up plan: Return in about 6 months (around 07/26/2018) for follow up with pre-visit labs.  Marquis Lunch, MD Phone: [phone]  Fax: [phone]  -  This note was partially dictated with voice recognition software. Similar sounding words can be transcribed inadequately or may not  be corrected upon review.  01/23/2018, 4:37 PM

## 2018-02-07 ENCOUNTER — Ambulatory Visit (HOSPITAL_COMMUNITY)
Admission: RE | Admit: 2018-02-07 | Discharge: 2018-02-07 | Disposition: A | Discharge status: Home / Self Care | Payer: BLUE CROSS/BLUE SHIELD | Source: Ambulatory Visit | Attending: Internal Medicine | Admitting: Internal Medicine

## 2018-02-07 ENCOUNTER — Encounter (HOSPITAL_COMMUNITY): Payer: Self-pay | Admitting: *Deleted

## 2018-02-07 ENCOUNTER — Encounter (HOSPITAL_COMMUNITY)
Admission: RE | Disposition: A | Discharge status: Home / Self Care | Payer: Self-pay | Source: Ambulatory Visit | Attending: Internal Medicine

## 2018-02-07 ENCOUNTER — Other Ambulatory Visit: Payer: Self-pay

## 2018-02-07 DIAGNOSIS — K644 Residual hemorrhoidal skin tags: Secondary | ICD-10-CM

## 2018-02-07 DIAGNOSIS — E890 Postprocedural hypothyroidism: Secondary | ICD-10-CM | POA: Diagnosis not present

## 2018-02-07 DIAGNOSIS — Z79890 Hormone replacement therapy (postmenopausal): Secondary | ICD-10-CM | POA: Diagnosis not present

## 2018-02-07 DIAGNOSIS — Q438 Other specified congenital malformations of intestine: Secondary | ICD-10-CM | POA: Insufficient documentation

## 2018-02-07 DIAGNOSIS — E119 Type 2 diabetes mellitus without complications: Secondary | ICD-10-CM | POA: Diagnosis not present

## 2018-02-07 DIAGNOSIS — J45909 Unspecified asthma, uncomplicated: Secondary | ICD-10-CM | POA: Insufficient documentation

## 2018-02-07 DIAGNOSIS — Z7984 Long term (current) use of oral hypoglycemic drugs: Secondary | ICD-10-CM | POA: Insufficient documentation

## 2018-02-07 DIAGNOSIS — Z79899 Other long term (current) drug therapy: Secondary | ICD-10-CM | POA: Diagnosis not present

## 2018-02-07 DIAGNOSIS — Z1211 Encounter for screening for malignant neoplasm of colon: Secondary | ICD-10-CM

## 2018-02-07 DIAGNOSIS — K648 Other hemorrhoids: Secondary | ICD-10-CM | POA: Diagnosis not present

## 2018-02-07 DIAGNOSIS — I10 Essential (primary) hypertension: Secondary | ICD-10-CM | POA: Diagnosis not present

## 2018-02-07 HISTORY — PX: COLONOSCOPY: SHX5424

## 2018-02-07 HISTORY — DX: Type 2 diabetes mellitus without complications: E11.9

## 2018-02-07 HISTORY — DX: Essential (primary) hypertension: I10

## 2018-02-07 HISTORY — DX: Unspecified asthma, uncomplicated: J45.909

## 2018-02-07 LAB — GLUCOSE, CAPILLARY: Glucose-Capillary: 84 mg/dL (ref 65–99)

## 2018-02-07 SURGERY — COLONOSCOPY
Anesthesia: Moderate Sedation

## 2018-02-07 MED ORDER — SODIUM CHLORIDE 0.9 % IV SOLN
INTRAVENOUS | Status: DC
  Administered 2018-02-07: 08:00:00 via INTRAVENOUS

## 2018-02-07 MED ORDER — STERILE WATER FOR IRRIGATION IR SOLN
Status: DC | PRN
  Administered 2018-02-07: 08:00:00

## 2018-02-07 MED ORDER — MEPERIDINE HCL 50 MG/ML IJ SOLN
INTRAMUSCULAR | Status: DC | PRN
  Administered 2018-02-07 (×2): 25 mg via INTRAVENOUS

## 2018-02-07 MED ORDER — MIDAZOLAM HCL 5 MG/5ML IJ SOLN
INTRAMUSCULAR | Status: DC | PRN
  Administered 2018-02-07: 2 mg via INTRAVENOUS
  Administered 2018-02-07: 1 mg via INTRAVENOUS
  Administered 2018-02-07: 2 mg via INTRAVENOUS
  Administered 2018-02-07: 1 mg via INTRAVENOUS

## 2018-02-07 MED ORDER — MIDAZOLAM HCL 5 MG/5ML IJ SOLN
INTRAMUSCULAR | Status: AC
  Filled 2018-02-07: qty 10

## 2018-02-07 MED ORDER — MEPERIDINE HCL 50 MG/ML IJ SOLN
INTRAMUSCULAR | Status: AC
  Filled 2018-02-07: qty 1

## 2018-02-07 NOTE — Discharge Instructions (Signed)
Resume usual medications and diet as before. No driving for 24 hours. Next screening exam in 10 years.   Colonoscopy, Adult, Care After This sheet gives you information about how to care for yourself after your procedure. Your health care provider may also give you more specific instructions. If you have problems or questions, contact your health care provider. What can I expect after the procedure? After the procedure, it is common to have:  A small amount of blood in your stool for 24 hours after the procedure.  Some gas.  Mild abdominal cramping or bloating.  Follow these instructions at home: General instructions   For the first 24 hours after the procedure: ? Do not drive or use machinery. ? Do not sign important documents. ? Do not drink alcohol. ? Do your regular daily activities at a slower pace than normal. ? Eat soft, easy-to-digest foods. ? Rest often.  Take over-the-counter or prescription medicines only as told by your health care provider.  It is up to you to get the results of your procedure. Ask your health care provider, or the department performing the procedure, when your results will be ready. Relieving cramping and bloating  Try walking around when you have cramps or feel bloated.  Apply heat to your abdomen as told by your health care provider. Use a heat source that your health care provider recommends, such as a moist heat pack or a heating pad. ? Place a towel between your skin and the heat source. ? Leave the heat on for 20-30 minutes. ? Remove the heat if your skin turns bright red. This is especially important if you are unable to feel pain, heat, or cold. You may have a greater risk of getting burned. Eating and drinking  Drink enough fluid to keep your urine clear or pale yellow.  Resume your normal diet as instructed by your health care provider. Avoid heavy or fried foods that are hard to digest.  Avoid drinking alcohol for as long as  instructed by your health care provider. Contact a health care provider if:  You have blood in your stool 2-3 days after the procedure. Get help right away if:  You have more than a small spotting of blood in your stool.  You pass large blood clots in your stool.  Your abdomen is swollen.  You have nausea or vomiting.  You have a fever.  You have increasing abdominal pain that is not relieved with medicine. This information is not intended to replace advice given to you by your health care provider. Make sure you discuss any questions you have with your health care provider.

## 2018-02-07 NOTE — Op Note (Signed)
[facility] Patient Name: Denise Oneill Procedure Date: 02/07/2018 7:48 AM MRN: [id number] Date of Birth: [birth year] Attending MD: Lionel December , MD CSN: [id number] Age: 57 Admit Type: Outpatient Procedure:                Colonoscopy Indications:              Screening for colorectal malignant neoplasm Providers:                Lionel December, MD, Loma Messing B. Patsy Lager, RN, Dyann Ruddle Referring MD:             Angelique Holm, NP Medicines:                Meperidine 50 mg IV, Midazolam 6 mg IV Complications:            No immediate complications. Estimated Blood Loss:     Estimated blood loss: none. Procedure:                Pre-Anesthesia Assessment:                           - Prior to the procedure, a History and Physical                            was performed, and patient medications and                            allergies were reviewed. The patient's tolerance of                            previous anesthesia was also reviewed. The risks                            and benefits of the procedure and the sedation                            options and risks were discussed with the patient.                            All questions were answered, and informed consent                            was obtained. Prior Anticoagulants: The patient has                            taken no previous anticoagulant or antiplatelet                            agents. ASA Grade Assessment: II - A patient with                            mild systemic disease. After reviewing the risks  and benefits, the patient was deemed in                            satisfactory condition to undergo the procedure.                           After obtaining informed consent, the colonoscope                            was passed under direct vision. Throughout the                            procedure, the patient's blood pressure, pulse, and            oxygen saturations were monitored continuously. The                            [id number] ([id number]) scope was introduced through                            the anus and advanced to the the cecum, identified                            by appendiceal orifice and ileocecal valve. The                            colonoscopy was somewhat difficult due to a                            tortuous colon. Successful completion of the                            procedure was aided by changing the patient to a                            supine position and using manual pressure. The                            patient tolerated the procedure well. The quality                            of the bowel preparation was good. The ileocecal                            valve, appendiceal orifice, and rectum were                            photographed. Scope In: 8:17:28 AM Scope Out: 8:37:53 AM Scope Withdrawal Time: 0 hours 9 minutes 28 seconds  Total Procedure Duration: 0 hours 20 minutes 25 seconds  Findings:      Skin tags were found on perianal exam.      The colon (entire examined portion) appeared normal.      Internal hemorrhoids were found during retroflexion. The hemorrhoids       were  medium-sized. Impression:               - Perianal skin tags found on perianal exam.                           - The entire examined colon is normal.                           - Internal hemorrhoids.                           - No specimens collected. Moderate Sedation:      Moderate (conscious) sedation was administered by the endoscopy nurse       and supervised by the endoscopist. The following parameters were       monitored: oxygen saturation, heart rate, blood pressure, CO2       capnography and response to care. Total physician intraservice time was       27 minutes. Recommendation:           - Patient has a contact number available for                            emergencies. The signs and symptoms of  potential                            delayed complications were discussed with the                            patient. Return to normal activities tomorrow.                            Written discharge instructions were provided to the                            patient.                           - Resume previous diet today.                           - Continue present medications.                           - Repeat colonoscopy in 10 years for screening                            purposes. Procedure Code(s):        --- Professional ---                           [phone], Colonoscopy, flexible; diagnostic, including                            collection of specimen(s) by brushing or washing,                            when performed (separate procedure)  G0500, Moderate sedation services provided by the                            same physician or other qualified health care                            professional performing a gastrointestinal                            endoscopic service that sedation supports,                            requiring the presence of an independent trained                            observer to assist in the monitoring of the                            patient's level of consciousness and physiological                            status; initial 15 minutes of intra-service time;                            patient age 34 years or older (additional time may                            be reported with [phone], as appropriate)                           [phone], Moderate sedation services provided by the                            same physician or other qualified health care                            professional performing the diagnostic or                            therapeutic service that the sedation supports,                            requiring the presence of an independent trained                            observer to assist in the  monitoring of the                            patient's level of consciousness and physiological                            status; each additional 15 minutes intraservice  time (List separately in addition to code for                            primary service) Diagnosis Code(s):        --- Professional ---                           Z12.11, Encounter for screening for malignant                            neoplasm of colon                           K64.8, Other hemorrhoids                           K64.4, Residual hemorrhoidal skin tags CPT copyright 2017 [employer]. All rights reserved. The codes documented in this report are preliminary and upon coder review may  be revised to meet current compliance requirements. Lionel December, MD Lionel December, MD 02/07/2018 8:45:02 AM This report has been signed electronically. Number of Addenda: 0

## 2018-02-07 NOTE — H&P (Signed)
Denise Oneill is an 57 y.o. female.   Chief Complaint: Patient is here for colonoscopy. HPI: Patient is 57 year old Afro-American female who is here for screening colonoscopy.  She states she had a colonoscopy several years ago and was normal.  She denies abdominal pain change in bowel habits rectal bleeding. Family history is negative for CRC.  Past Medical History:  Diagnosis Date  . Asthma   . Diabetes mellitus without complication (HCC)   . Goiter   . Hypertension   . Hypothyroidism     Past Surgical History:  Procedure Laterality Date  . Partial Thyroidiectomy      Family History  Problem Relation Age of Onset  . Diabetes Mother   . Hypertension Mother   . Hyperlipidemia Mother   . Cancer Father   . Colon cancer Neg Hx    Social History:  reports that she has never smoked. She has never used smokeless tobacco. She reports that she does not drink alcohol or use drugs.  Allergies:  Allergies  Allergen Reactions  . Penicillins Hives    Has patient had a PCN reaction causing immediate rash, facial/tongue/throat swelling, SOB or lightheadedness with hypotension: Yes Has patient had a PCN reaction causing severe rash involving mucus membranes or skin necrosis: No Has patient had a PCN reaction that required hospitalization: Yes Has patient had a PCN reaction occurring within the last 10 years: No If all of the above answers are "NO", then may proceed with Cephalosporin use.  . Sugar-Protein-Starch     Artificial sweeteners: give her a headache    Medications Prior to Admission  Medication Sig Dispense Refill  . albuterol (PROVENTIL HFA;VENTOLIN HFA) 108 (90 Base) MCG/ACT inhaler Inhale 2 puffs into the lungs every 6 (six) hours as needed for wheezing or shortness of breath.    . diphenhydrAMINE (BENADRYL) 25 MG tablet Take 25 mg by mouth daily as needed for allergies.    Marland Kitchen GAVILYTE-N WITH FLAVOR PACK 420 g solution Take 4,000 mLs by mouth daily.  0  . levothyroxine  (SYNTHROID, LEVOTHROID) 75 MCG tablet Take 1 tablet (75 mcg total) by mouth daily before breakfast. 30 tablet 6  . lisinopril-hydrochlorothiazide (PRINZIDE,ZESTORETIC) 10-12.5 MG tablet Take 1 tablet by mouth daily. 30 tablet 3  . metFORMIN (GLUCOPHAGE) 500 MG tablet TAKE 1 TABLET (500 MG TOTAL) DAILY WITH BREAKFAST BY MOUTH. 30 tablet 3  . Naphazoline-Pheniramine (OPCON-A OP) Place 1 drop into both eyes daily as needed (allergies).    . Omega-3 Fatty Acids (FISH OIL) 1000 MG CAPS Take 1,000 mg by mouth daily.       Results for orders placed or performed during the hospital encounter of 02/07/18 (from the past 48 hour(s))  Glucose, capillary     Status: None   Collection Time: 02/07/18  7:45 AM  Result Value Ref Range   Glucose-Capillary 84 65 - 99 mg/dL   No results found.  ROS  Blood pressure 135/64, pulse 66, temperature (!) 97.4 F (36.3 C), temperature source Oral, resp. rate 18, height  (1.702 m), weight 215 lb (97.5 kg), SpO2 100 %. Physical Exam  Constitutional: She appears well-developed and well-nourished.  HENT:  Mouth/Throat: Oropharynx is clear and moist.  Eyes: Conjunctivae are normal. No scleral icterus.  Neck: No thyromegaly present.  Cardiovascular: Normal rate, regular rhythm and normal heart sounds.  No murmur heard. Respiratory: Effort normal and breath sounds normal.  GI:  Abdomen is full but soft and nontender with organomegaly or masses.  Musculoskeletal: She  exhibits no edema.  Lymphadenopathy:    She has no cervical adenopathy.  Neurological: She is alert.  Skin: Skin is warm and dry.     Assessment/Plan Average risk screening colonoscopy.  Lionel December, MD 02/07/2018, 8:07 AM

## 2018-02-09 ENCOUNTER — Encounter (HOSPITAL_COMMUNITY): Payer: Self-pay | Admitting: Internal Medicine

## 2018-02-17 ENCOUNTER — Other Ambulatory Visit: Payer: Self-pay | Admitting: "Endocrinology

## 2018-03-16 ENCOUNTER — Other Ambulatory Visit: Payer: Self-pay | Admitting: "Endocrinology

## 2018-05-18 ENCOUNTER — Other Ambulatory Visit: Payer: Self-pay | Admitting: "Endocrinology

## 2018-06-17 ENCOUNTER — Other Ambulatory Visit: Payer: Self-pay | Admitting: "Endocrinology

## 2018-07-19 ENCOUNTER — Other Ambulatory Visit: Payer: Self-pay | Admitting: "Endocrinology

## 2018-07-20 LAB — LIPID PANEL
CHOL/HDL RATIO: 4.5 (calc) (ref <5.0)
CHOLESTEROL: 199 mg/dL (ref <200)
HDL: 44 mg/dL — AB (ref >50)
LDL CHOLESTEROL (CALC): 129 mg/dL — AB
Non-HDL Cholesterol (Calc): 155 mg/dL (calc) — ABNORMAL HIGH (ref <130)
Triglycerides: 143 mg/dL (ref <150)

## 2018-07-20 LAB — T4, FREE: Free T4: 1.4 ng/dL (ref 0.8–1.8)

## 2018-07-20 LAB — TSH: TSH: 0.55 m[IU]/L (ref 0.40–4.50)

## 2018-07-26 ENCOUNTER — Encounter: Payer: Self-pay | Admitting: "Endocrinology

## 2018-07-26 ENCOUNTER — Ambulatory Visit (INDEPENDENT_AMBULATORY_CARE_PROVIDER_SITE_OTHER): Payer: BLUE CROSS/BLUE SHIELD | Admitting: "Endocrinology

## 2018-07-26 VITALS — BP 130/82 | HR 64 | Ht 67.0 in | Wt 223.0 lb

## 2018-07-26 DIAGNOSIS — I10 Essential (primary) hypertension: Secondary | ICD-10-CM | POA: Diagnosis not present

## 2018-07-26 DIAGNOSIS — E782 Mixed hyperlipidemia: Secondary | ICD-10-CM

## 2018-07-26 DIAGNOSIS — E039 Hypothyroidism, unspecified: Secondary | ICD-10-CM

## 2018-07-26 DIAGNOSIS — E119 Type 2 diabetes mellitus without complications: Secondary | ICD-10-CM | POA: Diagnosis not present

## 2018-07-26 MED ORDER — LEVOTHYROXINE SODIUM 75 MCG PO TABS: 75.0000 ug | ORAL_TABLET | Freq: Every day | ORAL | 6 refills | Status: DC

## 2018-07-26 MED ORDER — ROSUVASTATIN CALCIUM 10 MG PO TABS: 10.0000 mg | ORAL_TABLET | Freq: Every day | ORAL | 3 refills | Status: DC

## 2018-07-26 NOTE — Progress Notes (Signed)
Endocrinology follow-up note   Subjective:    Patient ID: Denise Oneill, female    DOB: Feb 21, 1961, PCP Garald Braver   Past Medical History:  Diagnosis Date  . Asthma   . Diabetes mellitus without complication (HCC)   . Goiter   . Hypertension   . Hypothyroidism    Past Surgical History:  Procedure Laterality Date  . COLONOSCOPY N/A 02/07/2018   Procedure: COLONOSCOPY;  Surgeon: Malissa Hippo, MD;  Location: [facility];  Service: Endoscopy;  Laterality: N/A;  830  . Partial Thyroidiectomy     Social History   Socioeconomic History  . Marital status: Married    Spouse name: Not on file  . Number of children: Not on file  . Years of education: Not on file  . Highest education level: Not on file  Occupational History  . Not on file  Social Needs  . Financial resource strain: Not on file  . Food insecurity:    Worry: Not on file    Inability: Not on file  . Transportation needs:    Medical: Not on file    Non-medical: Not on file  Tobacco Use  . Smoking status: Never Smoker  . Smokeless tobacco: Never Used  Substance and Sexual Activity  . Alcohol use: No  . Drug use: No  . Sexual activity: Not on file  Lifestyle  . Physical activity:    Days per week: Not on file    Minutes per session: Not on file  . Stress: Not on file  Relationships  . Social connections:    Talks on phone: Not on file    Gets together: Not on file    Attends religious service: Not on file    Active member of club or organization: Not on file    Attends meetings of clubs or organizations: Not on file    Relationship status: Not on file  Other Topics Concern  . Not on file  Social History Narrative  . Not on file   Outpatient Encounter Medications as of 07/26/2018  Medication Sig  . albuterol (PROVENTIL HFA;VENTOLIN HFA) 108 (90 Base) MCG/ACT inhaler Inhale 2 puffs into the lungs every 6 (six) hours as needed for wheezing or shortness of breath.  . levothyroxine  (SYNTHROID, LEVOTHROID) 75 MCG tablet Take 1 tablet (75 mcg total) by mouth daily before breakfast.  . lisinopril-hydrochlorothiazide (PRINZIDE,ZESTORETIC) 10-12.5 MG tablet TAKE 1 TABLET BY MOUTH EVERY DAY  . metFORMIN (GLUCOPHAGE) 500 MG tablet TAKE 1 TABLET (500 MG TOTAL) DAILY WITH BREAKFAST BY MOUTH.  . Omega-3 Fatty Acids (FISH OIL) 1000 MG CAPS Take 1,000 mg by mouth daily.   . rosuvastatin (CRESTOR) 10 MG tablet Take 1 tablet (10 mg total) by mouth daily.  . [DISCONTINUED] diphenhydrAMINE (BENADRYL) 25 MG tablet Take 25 mg by mouth daily as needed for allergies.  . [DISCONTINUED] GAVILYTE-N WITH FLAVOR PACK 420 g solution Take 4,000 mLs by mouth daily.  . [DISCONTINUED] levothyroxine (SYNTHROID, LEVOTHROID) 75 MCG tablet TAKE 1 TABLET (75 MCG TOTAL) BY MOUTH DAILY BEFORE BREAKFAST.  . [DISCONTINUED] Naphazoline-Pheniramine (OPCON-A OP) Place 1 drop into both eyes daily as needed (allergies).   No facility-administered encounter medications on file as of 07/26/2018.    ALLERGIES: Allergies  Allergen Reactions  . Penicillins Hives    Has patient had a PCN reaction causing immediate rash, facial/tongue/throat swelling, SOB or lightheadedness with hypotension: Yes Has patient had a PCN reaction causing severe rash involving mucus membranes or skin  necrosis: No Has patient had a PCN reaction that required hospitalization: Yes Has patient had a PCN reaction occurring within the last 10 years: No If all of the above answers are "NO", then may proceed with Cephalosporin use.  . Sugar-Protein-Starch     Artificial sweeteners: give her a headache    VACCINATION STATUS:  There is no immunization history on file for this patient.  HPI 57 year old female patient with medical history as above. She is being seen in follow-up for hypothyroidism , type 2 diabetes, hyperlipidemia, and hypertension .  - Her history includes partial left hemithyroidectomy in 2000 for nodular goiter. Per her  report, she did not require any thyroid hormone supplement until 2017,  when she was initiated on low-dose levothyroxine, currently at  75 g by mouth every morning. -She reports compliance to this medication. -She gained 7 pounds since last visit.  She denies palpitations, tremors, heat/cold intolerance.   - she has no new complaints today.    - She denies dysphagia or shortness of breath. - She also has type 2 diabetes with recent A1c of 5.8% from 7.1%,  on metformin 500mg  po qday.  - She has  current dyslipidemia including LDL 129 unchanged from 133, not on medication. She is taking Red Yeast Rice from over-the-counter.  Review of Systems  Constitutional: + gained weight,no subjective hyperthermia, no subjective hypothermia Eyes: no blurry vision, no xerophthalmia ENT: no sore throat, no nodules palpated in throat, no dysphagia/odynophagia, no hoarseness Cardiovascular: No pain, no palpitations.    Respiratory: no cough, no SOB Gastrointestinal: no nausea, no diarrhea.   Musculoskeletal: no muscle/joint aches Skin: no rashes Neurological: no tremors, no numbness, no tingling, no dizziness Psychiatric: no depression, no anxiety  Objective:    BP 130/82   Pulse 64   Ht 5\' 7"  (1.702 m)   Wt 223 lb (101.2 kg)   BMI 34.93 kg/m   Wt Readings from Last 3 Encounters:  07/26/18 223 lb (101.2 kg)  02/07/18 215 lb (97.5 kg)  01/23/18 215 lb (97.5 kg)    Physical Exam  Constitutional:  + Obese , not in acute distress, normal state of mind Eyes: PERRLA, EOMI, no exophthalmos ENT: + moist mucous membranes, +  palpable asymmetric thyromegaly R>L, no cervical lymphadenopathy Cardiovascular: normal precordial activity, Regular Rate and Rhythm, no Murmur/Rubs/Gallops  Skin: moist, warm, no rashes Neurological: no tremor with outstretched hands, Deep tendon reflexes normal in all four extremities.  Recent Results (from the past 2160 hour(s))  T4, free     Status: None   Collection  Time: 07/20/18 10:28 AM  Result Value Ref Range   Free T4 1.4 0.8 - 1.8 ng/dL  TSH     Status: None   Collection Time: 07/20/18 10:28 AM  Result Value Ref Range   TSH 0.55 0.40 - 4.50 mIU/L  Lipid panel     Status: Abnormal   Collection Time: 07/20/18 10:28 AM  Result Value Ref Range   Cholesterol 199 <200 mg/dL   HDL 44 (L) >32 mg/dL   Triglycerides 440 <102 mg/dL   LDL Cholesterol (Calc) 129 (H) mg/dL (calc)    Comment: Reference range: <100 . Desirable range <100 mg/dL for primary prevention;   <70 mg/dL for patients with CHD or diabetic patients  with > or = 2 CHD risk factors. Marland Kitchen LDL-C is now calculated using the Martin-Hopkins  calculation, which is a validated novel method providing  better accuracy than the Friedewald equation in the  estimation of  LDL-C.  Horald Pollen et al. Lenox Ahr. 1610;960(45): 2061-2068  (http://education.[facility].com/faq/FAQ164)    Total CHOL/HDL Ratio 4.5 <5.0 (calc)   Non-HDL Cholesterol (Calc) 155 (H) <130 mg/dL (calc)    Comment: For patients with diabetes plus 1 major ASCVD risk  factor, treating to a non-HDL-C goal of <100 mg/dL  (LDL-C of <40 mg/dL) is considered a therapeutic  option.       Assessment & Plan:   1. Hypothyroidism- postsurgical -Her thyroid function tests are consistent with appropriate replacement.   -She is advised to continue on levothyroxine 75 mcg p.o. every morning.    - We discussed about correct intake of levothyroxine, at fasting, with water, separated by at least 30 minutes from breakfast, and separated by more than 4 hours from calcium, iron, multivitamins, acid reflux medications (PPIs). -Patient is made aware of the fact that thyroid hormone replacement is needed for life, dose to be adjusted by periodic monitoring of thyroid function tests.   2. Controlled type 2 diabetes mellitus   - This patient has significant metabolic syndrome including type 2 diabetes, dyslipidemia, hypertension, obesity,  relatively sedentary life. -  She is regaining weight after she lost 32 pounds.  Her most recent A1c was 5.8%.  She will continue to benefit from low-dose metformin therapy.  She is advised to continue metformin 500 mg p.o. daily with breakfast.   -  Suggestion is made for her to avoid simple carbohydrates  from her diet including Cakes, Sweet Desserts / Pastries, Ice Cream, Soda (diet and regular), Sweet Tea, Candies, Chips, Cookies, Store Bought Juices, Alcohol in Excess of  1-2 drinks a day, Artificial Sweeteners, and "Sugar-free" Products. This will help patient to have stable blood glucose profile and potentially avoid unintended weight gain.   3. Nodular goiter - Thyroid/neck ultrasound showed surgically absent left lobe and isthmus, right lobe 5.7 centimeters slightly enlarged. She has no discrete nodules.  -She will not require any intervention at this time.  4. Hypertension: Her blood pressure is controlled to target.    She is advised to continue  lisinopril/HCTZ 10/12.5 mg p.o. daily.  I advised her on salt restriction, needs follow-up.    5. Hyperlipidemia: Controlled LDL at 129.  She has been  hesitant to treat with statin.  With some more discussion, she is more open today.  I discussed and initiated Crestor 10 mg p.o. nightly.  She is advised on precautions and side effects.  She is also continue on fish oil.  - I advised patient to maintain close follow up with Richarda Blade E for primary care needs.  - Time spent with the patient: 25 min, of which >50% was spent in reviewing her  current and  previous labs, previous treatments, and medications doses and developing a plan for long-term care.  Denise Oneill participated in the discussions, expressed understanding, and voiced agreement with the above plans.  All questions were answered to her satisfaction. she is encouraged to contact clinic should she have any questions or concerns prior to her return visit.  Follow up  plan: Return in about 6 months (around 01/24/2019) for Follow up with Pre-visit Labs.  Marquis Lunch, MD Phone: [phone]  Fax: [phone]  -  This note was partially dictated with voice recognition software. Similar sounding words can be transcribed inadequately or may not  be corrected upon review.  07/26/2018, 4:14 PM

## 2018-10-21 ENCOUNTER — Other Ambulatory Visit: Payer: Self-pay | Admitting: "Endocrinology

## 2018-11-21 ENCOUNTER — Other Ambulatory Visit: Payer: Self-pay | Admitting: "Endocrinology

## 2018-11-28 ENCOUNTER — Other Ambulatory Visit: Payer: Self-pay

## 2018-11-28 MED ORDER — METFORMIN HCL 500 MG PO TABS: 500.0000 mg | ORAL_TABLET | Freq: Every day | ORAL | 1 refills | Status: DC

## 2018-12-28 IMAGING — US US SOFT TISSUE HEAD/NECK
1 series · 14 of 25 positions shown · non-contrast
Comparison: None.

CLINICAL DATA: Hypothyroid. 56-year-old female with a history of
hypothyroidism and thyroid goiter status post left hemi
thyroidectomy in 8555

EXAM:
THYROID ULTRASOUND
TECHNIQUE: Ultrasound examination of the thyroid gland and adjacent soft
tissues was performed.

[Series 1: us soft tissue head/neck · 0.06mm/px · 14 of 36 slices shown]
[im 1/36]
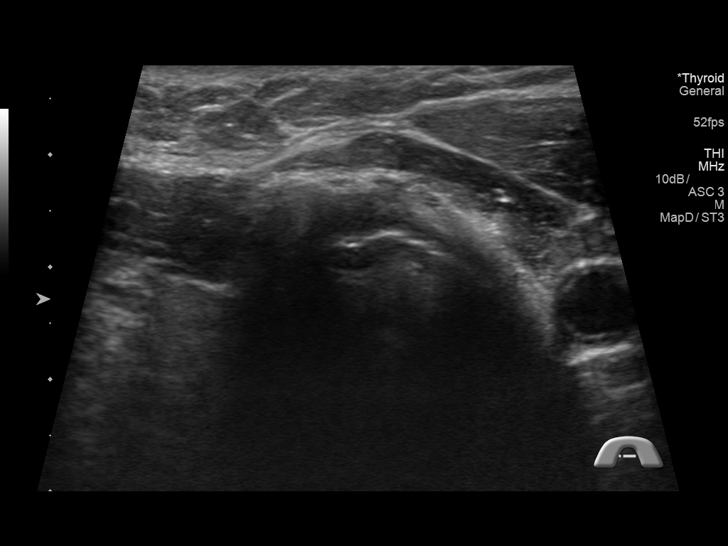
[im 3/36]
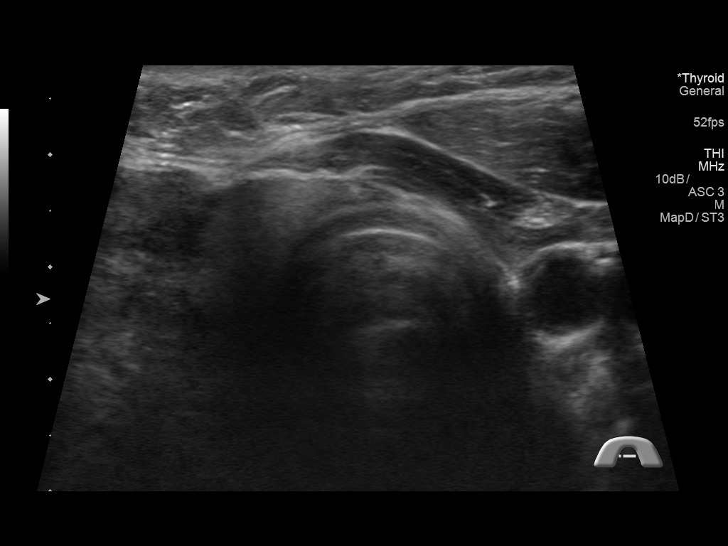
[im 6/36]
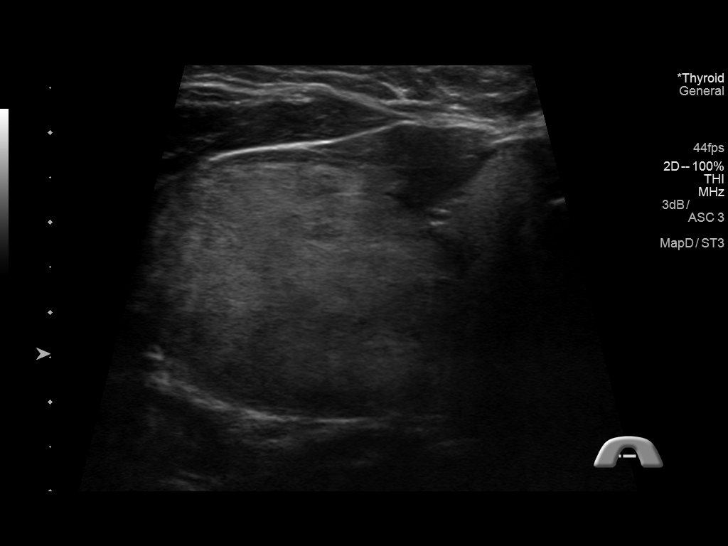
[im 9/36]
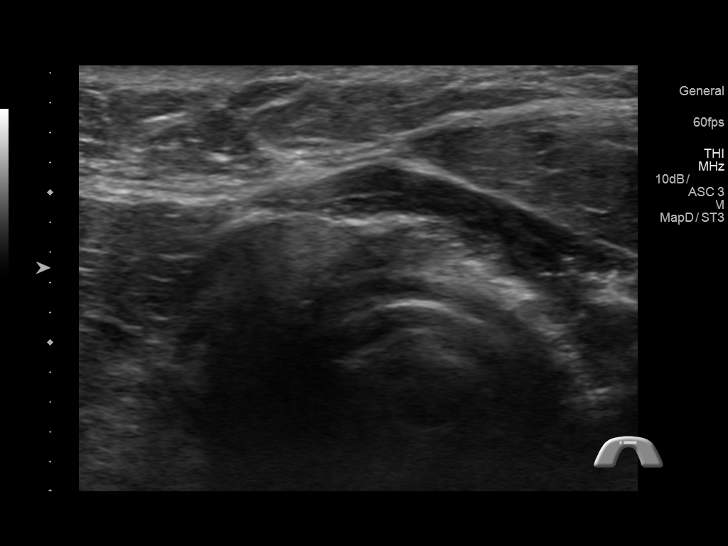
[im 12/36]
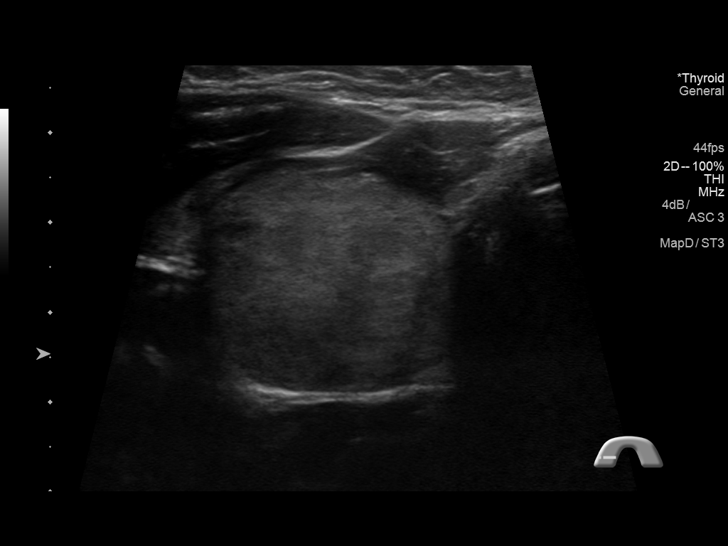
[im 14/36]
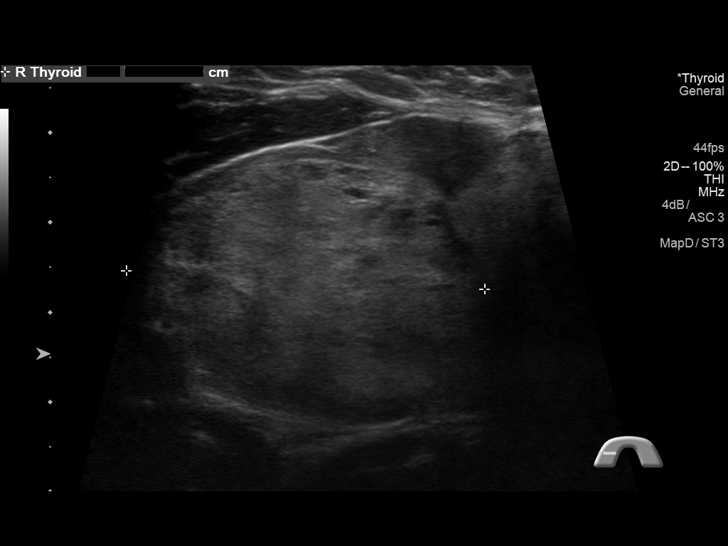
[im 17/36]
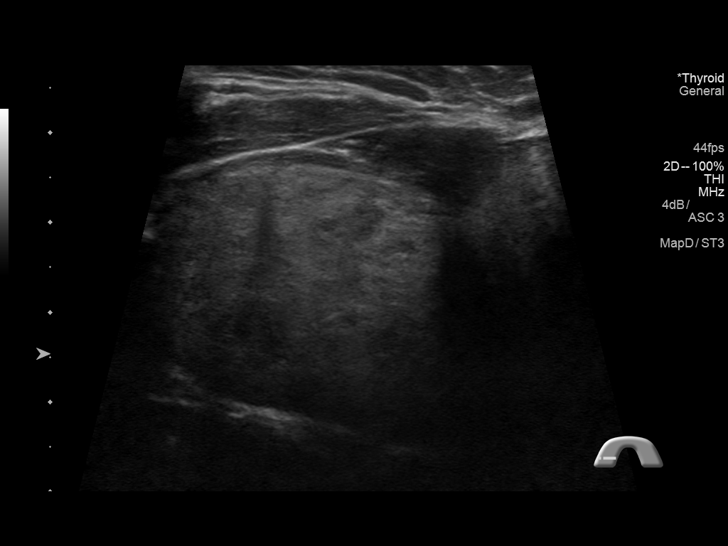
[im 19/36]
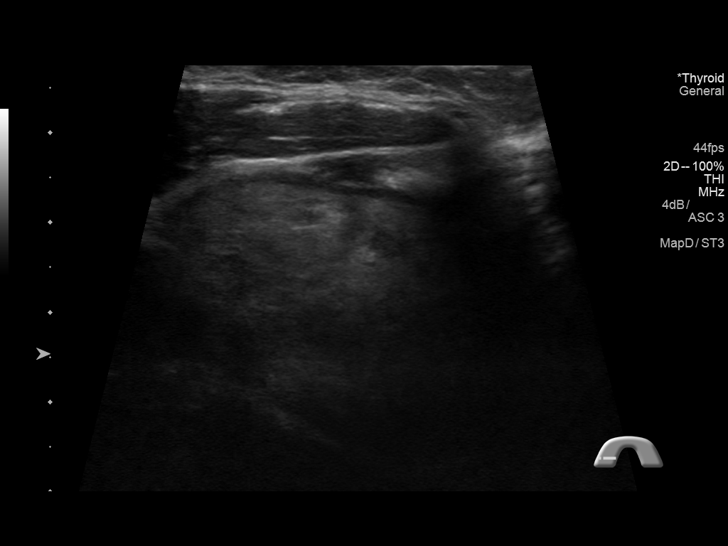
[im 22/36]
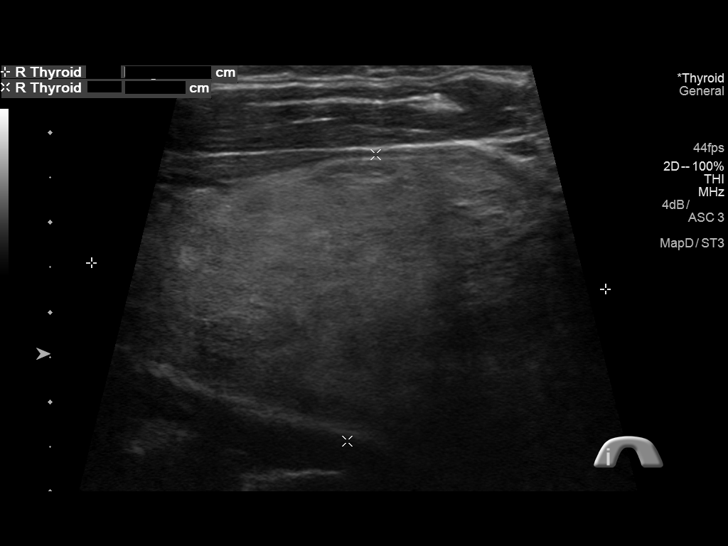
[im 24/36]
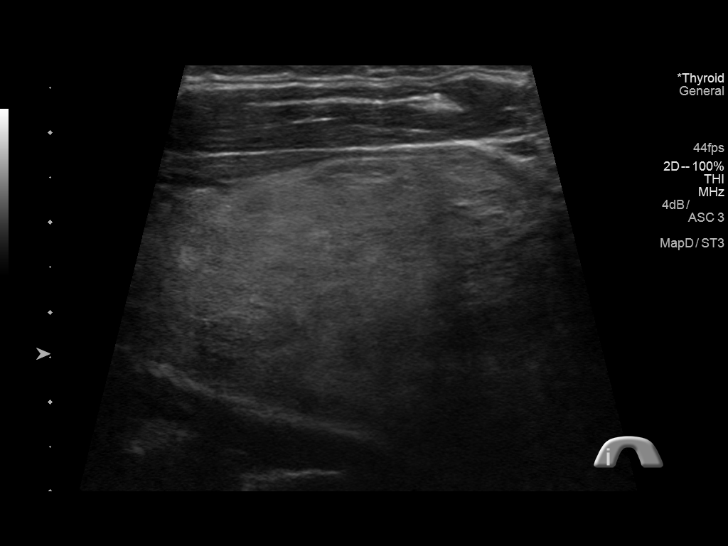
[im 27/36]
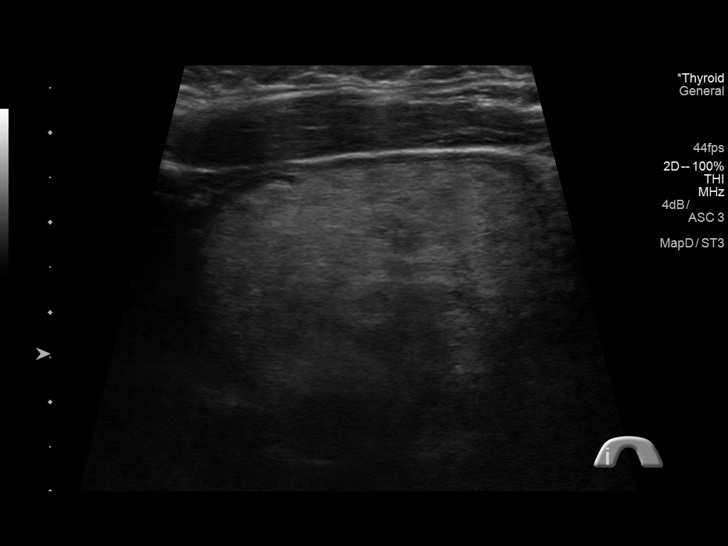
[im 30/36]
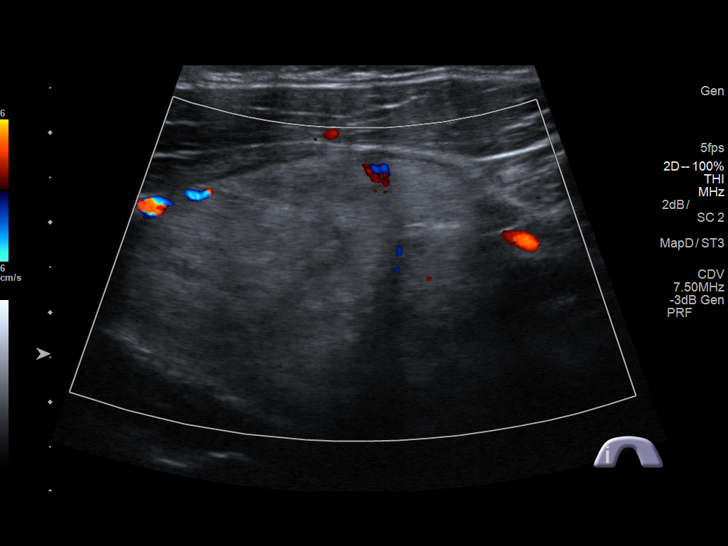
[im 33/36]
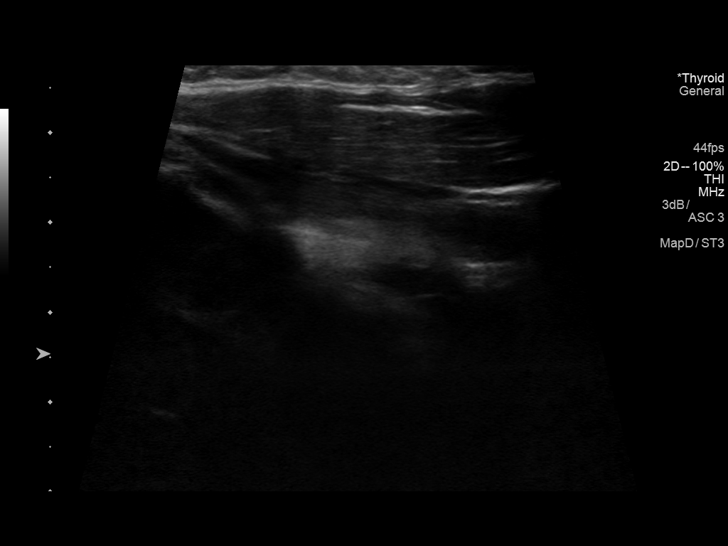
[im 36/36]
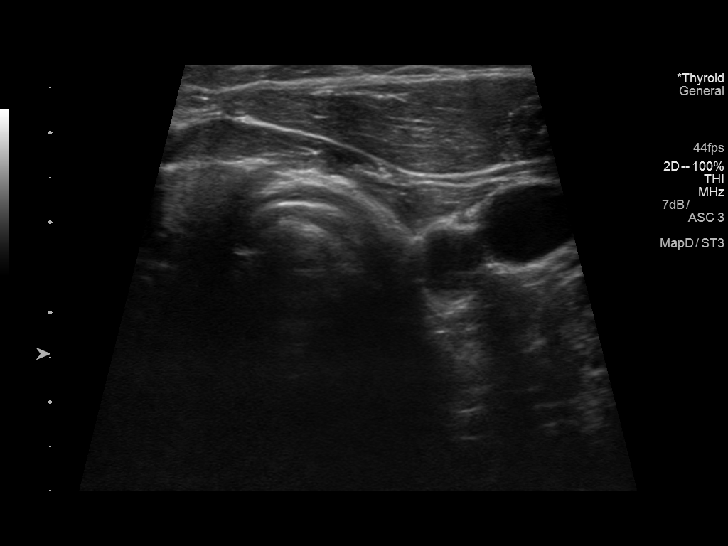

[14 of 25 positions shown; findings below may reference images not displayed]

FINDINGS: Parenchymal Echotexture: Moderately heterogenous

Isthmus: Surgically absent

Right lobe: 5.7 x 3.2 x 4.0 cm

Left lobe: Surgically absent

_________________________________________________________

Estimated total number of nodules >/= 1 cm: 0

Number of spongiform nodules >/=  2 cm not described below (TR1): 0

Number of mixed cystic and solid nodules >/= 1.5 cm not described
below (TR2): 0

_________________________________________________________

No discrete nodules are seen within the thyroid gland.
IMPRESSION: 1. Surgical changes of prior left hemithyroidectomy.
2. Enlarged, heterogeneous and lobular right-sided thyroid gland
most consistent with diffuse goitrous change. No discrete nodules
are identified.

## 2019-01-09 ENCOUNTER — Other Ambulatory Visit: Payer: Self-pay | Admitting: "Endocrinology

## 2019-01-24 ENCOUNTER — Ambulatory Visit: Payer: BLUE CROSS/BLUE SHIELD | Admitting: "Endocrinology

## 2019-01-28 ENCOUNTER — Ambulatory Visit: Payer: BLUE CROSS/BLUE SHIELD | Admitting: "Endocrinology

## 2019-02-26 ENCOUNTER — Other Ambulatory Visit: Payer: Self-pay

## 2019-02-26 ENCOUNTER — Other Ambulatory Visit: Payer: Self-pay | Admitting: "Endocrinology

## 2019-02-26 DIAGNOSIS — E1165 Type 2 diabetes mellitus with hyperglycemia: Secondary | ICD-10-CM

## 2019-02-26 DIAGNOSIS — IMO0002 Reserved for concepts with insufficient information to code with codable children: Secondary | ICD-10-CM

## 2019-02-26 DIAGNOSIS — E039 Hypothyroidism, unspecified: Secondary | ICD-10-CM

## 2019-02-26 DIAGNOSIS — E782 Mixed hyperlipidemia: Secondary | ICD-10-CM

## 2019-02-27 LAB — COMPREHENSIVE METABOLIC PANEL
AG Ratio: 1.5 (calc) (ref 1.0–2.5)
ALT: 16 U/L (ref 6–29)
AST: 27 U/L (ref 10–35)
Albumin: 4.2 g/dL (ref 3.6–5.1)
Alkaline phosphatase (APISO): 50 U/L (ref 37–153)
BUN: 12 mg/dL (ref 7–25)
CO2: 28 mmol/L (ref 20–32)
Calcium: 9.7 mg/dL (ref 8.6–10.4)
Chloride: 103 mmol/L (ref 98–110)
Creat: 0.79 mg/dL (ref 0.50–1.05)
Globulin: 2.8 g/dL (calc) (ref 1.9–3.7)
Glucose, Bld: 93 mg/dL (ref 65–99)
Potassium: 4.3 mmol/L (ref 3.5–5.3)
Sodium: 138 mmol/L (ref 135–146)
Total Bilirubin: 0.5 mg/dL (ref 0.2–1.2)
Total Protein: 7 g/dL (ref 6.1–8.1)

## 2019-02-27 LAB — HEMOGLOBIN A1C
Hgb A1c MFr Bld: 6.2 % of total Hgb — ABNORMAL HIGH (ref <5.7)
Mean Plasma Glucose: 131 (calc)
eAG (mmol/L): 7.3 (calc)

## 2019-02-27 LAB — LIPID PANEL
Cholesterol: 131 mg/dL (ref <200)
HDL: 42 mg/dL — ABNORMAL LOW (ref >=50)
LDL Cholesterol (Calc): 66 mg/dL (calc)
Non-HDL Cholesterol (Calc): 89 mg/dL (calc) (ref <130)
Total CHOL/HDL Ratio: 3.1 (calc) (ref <5.0)
Triglycerides: 145 mg/dL (ref <150)

## 2019-02-27 LAB — T4, FREE: Free T4: 1.1 ng/dL (ref 0.8–1.8)

## 2019-02-27 LAB — TSH: TSH: 0.9 mIU/L (ref 0.40–4.50)

## 2019-03-05 ENCOUNTER — Other Ambulatory Visit: Payer: Self-pay

## 2019-03-05 ENCOUNTER — Encounter: Payer: Self-pay | Admitting: "Endocrinology

## 2019-03-05 ENCOUNTER — Ambulatory Visit (INDEPENDENT_AMBULATORY_CARE_PROVIDER_SITE_OTHER): Payer: BC Managed Care – PPO | Admitting: "Endocrinology

## 2019-03-05 VITALS — BP 131/82 | HR 61 | Ht 67.0 in | Wt 227.8 lb

## 2019-03-05 DIAGNOSIS — E039 Hypothyroidism, unspecified: Secondary | ICD-10-CM | POA: Diagnosis not present

## 2019-03-05 DIAGNOSIS — I10 Essential (primary) hypertension: Secondary | ICD-10-CM | POA: Diagnosis not present

## 2019-03-05 DIAGNOSIS — IMO0002 Reserved for concepts with insufficient information to code with codable children: Secondary | ICD-10-CM

## 2019-03-05 DIAGNOSIS — E1165 Type 2 diabetes mellitus with hyperglycemia: Secondary | ICD-10-CM

## 2019-03-05 DIAGNOSIS — E782 Mixed hyperlipidemia: Secondary | ICD-10-CM | POA: Diagnosis not present

## 2019-03-05 DIAGNOSIS — E118 Type 2 diabetes mellitus with unspecified complications: Secondary | ICD-10-CM | POA: Diagnosis not present

## 2019-03-05 DIAGNOSIS — E119 Type 2 diabetes mellitus without complications: Secondary | ICD-10-CM

## 2019-03-05 MED ORDER — LEVOTHYROXINE SODIUM 75 MCG PO TABS: 75.0000 ug | ORAL_TABLET | Freq: Every day | ORAL | 1 refills | Status: DC

## 2019-03-05 MED ORDER — LISINOPRIL-HYDROCHLOROTHIAZIDE 10-12.5 MG PO TABS: 1.0000 | ORAL_TABLET | Freq: Every day | ORAL | 1 refills | Status: DC

## 2019-03-05 MED ORDER — ROSUVASTATIN CALCIUM 10 MG PO TABS: 10.0000 mg | ORAL_TABLET | Freq: Every day | ORAL | 3 refills | Status: DC

## 2019-03-05 MED ORDER — METFORMIN HCL 500 MG PO TABS: 500.0000 mg | ORAL_TABLET | Freq: Every day | ORAL | 1 refills | Status: DC

## 2019-03-05 NOTE — Patient Instructions (Signed)

## 2019-03-05 NOTE — Progress Notes (Signed)
Endocrinology follow-up note   Subjective:    Patient ID: Denise Oneill, female    DOB: 06-24-1961, PCP Denise Oneill, Denise Oneill   Past Medical History:  Diagnosis Date  . Asthma   . Diabetes mellitus without complication (HCC)   . Goiter   . Hypertension   . Hypothyroidism    Past Surgical History:  Procedure Laterality Date  . COLONOSCOPY N/A 02/07/2018   Procedure: COLONOSCOPY;  Surgeon: Malissa Hippoehman, Najeeb U, MD;  Location: [facility];  Service: Endoscopy;  Laterality: N/A;  830  . Partial Thyroidiectomy     Social History   Socioeconomic History  . Marital status: Married    Spouse name: Not on file  . Number of children: Not on file  . Years of education: Not on file  . Highest education level: Not on file  Occupational History  . Not on file  Social Needs  . Financial resource strain: Not on file  . Food insecurity    Worry: Not on file    Inability: Not on file  . Transportation needs    Medical: Not on file    Non-medical: Not on file  Tobacco Use  . Smoking status: Never Smoker  . Smokeless tobacco: Never Used  Substance and Sexual Activity  . Alcohol use: No  . Drug use: No  . Sexual activity: Not on file  Lifestyle  . Physical activity    Days per week: Not on file    Minutes per session: Not on file  . Stress: Not on file  Relationships  . Social Musicianconnections    Talks on phone: Not on file    Gets together: Not on file    Attends religious service: Not on file    Active member of club or organization: Not on file    Attends meetings of clubs or organizations: Not on file    Relationship status: Not on file  Other Topics Concern  . Not on file  Social History Narrative  . Not on file   Outpatient Encounter Medications as of 03/05/2019  Medication Sig  . albuterol (PROVENTIL HFA;VENTOLIN HFA) 108 (90 Base) MCG/ACT inhaler Inhale 2 puffs into the lungs every 6 (six) hours as needed for wheezing or shortness of breath.  . levothyroxine (SYNTHROID) 75  MCG tablet Take 1 tablet (75 mcg total) by mouth daily before breakfast.  . lisinopril-hydrochlorothiazide (ZESTORETIC) 10-12.5 MG tablet Take 1 tablet by mouth daily.  . metFORMIN (GLUCOPHAGE) 500 MG tablet Take 1 tablet (500 mg total) by mouth daily with breakfast.  . Omega-3 Fatty Acids (FISH OIL) 1000 MG CAPS Take 1,000 mg by mouth daily.   . rosuvastatin (CRESTOR) 10 MG tablet Take 1 tablet (10 mg total) by mouth daily.  . [DISCONTINUED] levothyroxine (SYNTHROID, LEVOTHROID) 75 MCG tablet Take 1 tablet (75 mcg total) by mouth daily before breakfast.  . [DISCONTINUED] lisinopril-hydrochlorothiazide (ZESTORETIC) 10-12.5 MG tablet TAKE 1 TABLET BY MOUTH EVERY DAY  . [DISCONTINUED] metFORMIN (GLUCOPHAGE) 500 MG tablet Take 1 tablet (500 mg total) by mouth daily with breakfast.  . [DISCONTINUED] rosuvastatin (CRESTOR) 10 MG tablet Take 1 tablet (10 mg total) by mouth daily.   No facility-administered encounter medications on file as of 03/05/2019.    ALLERGIES: Allergies  Allergen Reactions  . Penicillins Hives    Has patient had a PCN reaction causing immediate rash, facial/tongue/throat swelling, SOB or lightheadedness with hypotension: Yes Has patient had a PCN reaction causing severe rash involving mucus membranes or skin necrosis:  No Has patient had a PCN reaction that required hospitalization: Yes Has patient had a PCN reaction occurring within the last 10 years: No If all of the above answers are "NO", then may proceed with Cephalosporin use.  . Sugar-Protein-Starch     Artificial sweeteners: give her a headache    VACCINATION STATUS:  There is no immunization history on file for this patient.  HPI 58 year old female patient with medical history as above. She is being seen in follow-up for hypothyroidism , type 2 diabetes, hyperlipidemia, and hypertension .  - Her history includes partial left hemithyroidectomy in 2000 for nodular goiter.  -She is currently on levothyroxine 75  mcg p.o. every morning.  She reports better compliance to this medication  -She has difficulty losing weight. -  She denies palpitations, tremors, heat/cold intolerance.   - she has no new complaints today.    - She denies dysphagia or shortness of breath. - She also has type 2 diabetes with recent A1c of 6.2% increasing from 5.8%.   She remains on metformin 500 mg p.o. daily.    - She has  current dyslipidemia including LDL 129 unchanged from 133, not on medication. She is taking Red Yeast Rice from over-the-counter.  Review of Systems  Constitutional: + gained weight,no subjective hyperthermia, no subjective hypothermia Eyes: no blurry vision, no xerophthalmia ENT: no sore throat, no nodules palpated in throat, no dysphagia/odynophagia, no hoarseness  Musculoskeletal: no muscle/joint aches Skin: no rashes Neurological: no tremors, no numbness, no tingling, no dizziness Psychiatric: no depression, no anxiety  Objective:    BP 131/82   Pulse 61   Ht 5\' 7"  (1.702 m)   Wt 227 lb 12.8 oz (103.3 kg)   BMI 35.68 kg/m   Wt Readings from Last 3 Encounters:  03/05/19 227 lb 12.8 oz (103.3 kg)  07/26/18 223 lb (101.2 kg)  02/07/18 215 lb (97.5 kg)    Physical Exam  Constitutional:  not in acute distress, normal state of mind Eyes:  EOMI, no exophthalmos Neck: Supple Respiratory: Adequate breathing efforts Musculoskeletal: no gross deformities, strength intact in all four extremities Skin:  no rashes, no hyperemia Neurological: no tremor with outstretched hands.  Recent Results (from the past 2160 hour(s))  Lipid Panel     Status: Abnormal   Collection Time: 02/26/19  2:00 PM  Result Value Ref Range   Cholesterol 131 <200 mg/dL   HDL 42 (L) > OR = 50 mg/dL   Triglycerides 161145 <096<150 mg/dL   LDL Cholesterol (Calc) 66 mg/dL (calc)    Comment: Reference range: <100 . Desirable range <100 mg/dL for primary prevention;   <70 mg/dL for patients with CHD or diabetic patients   with > or = 2 CHD risk factors. Marland Kitchen. LDL-C is now calculated using the Martin-Hopkins  calculation, which is a validated novel method providing  better accuracy than the Friedewald equation in the  estimation of LDL-C.  Horald PollenMartin SS et al. Lenox AhrJAMA. 0454;098(112013;310(19): 2061-2068  (http://education.[facility].com/faq/FAQ164)    Total CHOL/HDL Ratio 3.1 <5.0 (calc)   Non-HDL Cholesterol (Calc) 89 <914<130 mg/dL (calc)    Comment: For patients with diabetes plus 1 major ASCVD risk  factor, treating to a non-HDL-C goal of <100 mg/dL  (LDL-C of <78<70 mg/dL) is considered a therapeutic  option.   Hemoglobin A1c     Status: Abnormal   Collection Time: 02/26/19  2:00 PM  Result Value Ref Range   Hgb A1c MFr Bld 6.2 (H) <5.7 % of total Hgb  Comment: For someone without known diabetes, a hemoglobin  A1c value between 5.7% and 6.4% is consistent with prediabetes and should be confirmed with a  follow-up test. . For someone with known diabetes, a value <7% indicates that their diabetes is well controlled. A1c targets should be individualized based on duration of diabetes, age, comorbid conditions, and other considerations. . This assay result is consistent with an increased risk of diabetes. . Currently, no consensus exists regarding use of hemoglobin A1c for diagnosis of diabetes for children. .    Mean Plasma Glucose 131 (calc)   eAG (mmol/L) 7.3 (calc)  Comprehensive metabolic panel     Status: None   Collection Time: 02/26/19  2:00 PM  Result Value Ref Range   Glucose, Bld 93 65 - 99 mg/dL    Comment: .            Fasting reference interval .    BUN 12 7 - 25 mg/dL   Creat 0.79 0.50 - 1.05 mg/dL    Comment: For patients >34 years of age, the reference limit for Creatinine is approximately 13% higher for people identified as African-American. .    BUN/Creatinine Ratio NOT APPLICABLE 6 - 22 (calc)   Sodium 138 135 - 146 mmol/L   Potassium 4.3 3.5 - 5.3 mmol/L   Chloride 103 98 -  110 mmol/L   CO2 28 20 - 32 mmol/L   Calcium 9.7 8.6 - 10.4 mg/dL   Total Protein 7.0 6.1 - 8.1 g/dL   Albumin 4.2 3.6 - 5.1 g/dL   Globulin 2.8 1.9 - 3.7 g/dL (calc)   AG Ratio 1.5 1.0 - 2.5 (calc)   Total Bilirubin 0.5 0.2 - 1.2 mg/dL   Alkaline phosphatase (APISO) 50 37 - 153 U/L   AST 27 10 - 35 U/L   ALT 16 6 - 29 U/L  TSH     Status: None   Collection Time: 02/26/19  2:00 PM  Result Value Ref Range   TSH 0.90 0.40 - 4.50 mIU/L  T4, Free     Status: None   Collection Time: 02/26/19  2:00 PM  Result Value Ref Range   Free T4 1.1 0.8 - 1.8 ng/dL      Assessment & Plan:   1. Hypothyroidism- postsurgical -Her thyroid function tests are consistent with appropriate replacement.    -She is advised to continue on levothyroxine 75 mcg p.o. every morning.    - We discussed about the correct intake of her thyroid hormone, on empty stomach at fasting, with water, separated by at least 30 minutes from breakfast and other medications,  and separated by more than 4 hours from calcium, iron, multivitamins, acid reflux medications (PPIs). -Patient is made aware of the fact that thyroid hormone replacement is needed for life, dose to be adjusted by periodic monitoring of thyroid function tests.   2. Controlled type 2 diabetes mellitus   - This patient has significant metabolic syndrome including type 2 diabetes, dyslipidemia, hypertension, obesity, relatively sedentary life. -  She is regaining some of the weight she lost previously.  Her most recent A1c is stable at 6.2%.    She will continue to benefit from metformin treatment.  She is advised to continue metformin 500 mg p.o. daily after breakfast.    - she  admits there is a room for improvement in her diet and drink choices. -  Suggestion is made for her to avoid simple carbohydrates  from her diet including Cakes, Sweet Desserts /  Pastries, Ice Cream, Soda (diet and regular), Sweet Tea, Candies, Chips, Cookies, Sweet Pastries,  Store  Bought Juices, Alcohol in Excess of  1-2 drinks a day, Artificial Sweeteners, Coffee Creamer, and "Sugar-free" Products. This will help patient to have stable blood glucose profile and potentially avoid unintended weight gain.   3. Nodular goiter - Thyroid/neck ultrasound showed surgically absent left lobe and isthmus, right lobe 5.7 centimeters slightly enlarged. She has no discrete nodules.  -She will not require any intervention at this time.  4. Hypertension: Her blood pressure is controlled to target.   She is advised to continue  lisinopril/HCTZ 10/12.5 mg p.o. daily.  I advised her on salt restriction, needs follow-up.    5. Hyperlipidemia: Controlled LDL at next T6 improving from 129.  She has been more consistent taking her Crestor.  She is advised to continue Crestor 10 mg p.o. daily at bedtime.  She will be considered for a lower dose on subsequent visits.   She is advised on precautions and side effects.  She is also continue on fish oil.  - I advised patient to maintain close follow up with Richarda BladeElliott, Denise Oneill for primary care needs.  - Time spent with the patient: 25 min, of which >50% was spent in reviewing her  current and  previous labs/studies, previous treatments, and medications doses and developing a plan for long-term care based on the latest recommendations for standards of care. Please refer to " Patient Self Inventory" in the Media  tab for reviewed elements of pertinent patient history. Denise SpearmanAlice Oneill participated in the discussions, expressed understanding, and voiced agreement with the above plans.  All questions were answered to her satisfaction. she is encouraged to contact clinic should she have any questions or concerns prior to her return visit.   Follow up plan: Return in about 6 months (around 09/04/2019) for Follow up with Pre-visit Labs.  Marquis LunchGebre Jaccob Czaplicki, MD Phone: [phone]  Fax: [phone]  -  This note was partially dictated with voice recognition software.  Similar sounding words can be transcribed inadequately or may not  be corrected upon review.  03/05/2019, 4:07 PM

## 2019-06-30 ENCOUNTER — Other Ambulatory Visit: Payer: Self-pay | Admitting: "Endocrinology

## 2019-08-26 ENCOUNTER — Other Ambulatory Visit: Payer: Self-pay | Admitting: Specialist

## 2019-08-26 DIAGNOSIS — R928 Other abnormal and inconclusive findings on diagnostic imaging of breast: Secondary | ICD-10-CM

## 2019-08-30 ENCOUNTER — Other Ambulatory Visit: Payer: Self-pay

## 2019-08-30 ENCOUNTER — Ambulatory Visit
Admission: RE | Admit: 2019-08-30 | Discharge: 2019-08-30 | Disposition: A | Discharge status: Home / Self Care | Payer: BC Managed Care – PPO | Source: Ambulatory Visit | Attending: Specialist | Admitting: Specialist

## 2019-08-30 DIAGNOSIS — R928 Other abnormal and inconclusive findings on diagnostic imaging of breast: Secondary | ICD-10-CM

## 2019-09-04 LAB — HEMOGLOBIN A1C
Hgb A1c MFr Bld: 6.6 % of total Hgb — ABNORMAL HIGH (ref <5.7)
Mean Plasma Glucose: 143 (calc)
eAG (mmol/L): 7.9 (calc)

## 2019-09-04 LAB — T4, FREE: Free T4: 1.2 ng/dL (ref 0.8–1.8)

## 2019-09-04 LAB — TSH: TSH: 1.35 mIU/L (ref 0.40–4.50)

## 2019-09-09 ENCOUNTER — Ambulatory Visit (INDEPENDENT_AMBULATORY_CARE_PROVIDER_SITE_OTHER): Payer: BC Managed Care – PPO | Admitting: "Endocrinology

## 2019-09-09 ENCOUNTER — Other Ambulatory Visit: Payer: Self-pay | Admitting: "Endocrinology

## 2019-09-09 ENCOUNTER — Encounter: Payer: Self-pay | Admitting: "Endocrinology

## 2019-09-09 DIAGNOSIS — E118 Type 2 diabetes mellitus with unspecified complications: Secondary | ICD-10-CM | POA: Diagnosis not present

## 2019-09-09 DIAGNOSIS — E1165 Type 2 diabetes mellitus with hyperglycemia: Secondary | ICD-10-CM

## 2019-09-09 DIAGNOSIS — E782 Mixed hyperlipidemia: Secondary | ICD-10-CM

## 2019-09-09 DIAGNOSIS — E039 Hypothyroidism, unspecified: Secondary | ICD-10-CM

## 2019-09-09 DIAGNOSIS — I10 Essential (primary) hypertension: Secondary | ICD-10-CM | POA: Diagnosis not present

## 2019-09-09 DIAGNOSIS — IMO0002 Reserved for concepts with insufficient information to code with codable children: Secondary | ICD-10-CM

## 2019-09-09 MED ORDER — METFORMIN HCL 500 MG PO TABS: 500.0000 mg | ORAL_TABLET | Freq: Two times a day (BID) | ORAL | 1 refills | Status: DC

## 2019-09-09 NOTE — Progress Notes (Signed)
09/09/2019                                Endocrinology Telehealth Visit Follow up Note -During COVID -19 Pandemic  I connected with Denise Oneill on 09/09/2019   by telephone and verified that I am speaking with the correct person using two identifiers. Denise Oneill, [birth year]. she has verbally consented to this visit. All issues noted in this document were discussed and addressed. The format was not optimal for physical exam.   Subjective:    Patient ID: Denise Oneill, female    DOB: 1960-12-17, PCP Thea Alken   Past Medical History:  Diagnosis Date  . Asthma   . Diabetes mellitus without complication ([place])   . Goiter   . Hypertension   . Hypothyroidism    Past Surgical History:  Procedure Laterality Date  . COLONOSCOPY N/A 02/07/2018   Procedure: COLONOSCOPY;  Surgeon: Rogene Houston, MD;  Location: [facility];  Service: Endoscopy;  Laterality: N/A;  830  . Partial Thyroidiectomy     Social History   Socioeconomic History  . Marital status: Married    Spouse name: Not on file  . Number of children: Not on file  . Years of education: Not on file  . Highest education level: Not on file  Occupational History  . Not on file  Tobacco Use  . Smoking status: Never Smoker  . Smokeless tobacco: Never Used  Substance and Sexual Activity  . Alcohol use: No  . Drug use: No  . Sexual activity: Not on file  Other Topics Concern  . Not on file  Social History Narrative  . Not on file   Social Determinants of Health   Financial Resource Strain:   . Difficulty of Paying Living Expenses: Not on file  Food Insecurity:   . Worried About Charity fundraiser in the Last Year: Not on file  . Ran Out of Food in the Last Year: Not on file  Transportation Needs:   . Lack of Transportation (Medical): Not on file  . Lack of Transportation (Non-Medical): Not on file  Physical Activity:   . Days of Exercise per Week: Not on file  . Minutes of  Exercise per Session: Not on file  Stress:   . Feeling of Stress : Not on file  Social Connections:   . Frequency of Communication with Friends and Family: Not on file  . Frequency of Social Gatherings with Friends and Family: Not on file  . Attends Religious Services: Not on file  . Active Member of Clubs or Organizations: Not on file  . Attends Archivist Meetings: Not on file  . Marital Status: Not on file   Outpatient Encounter Medications as of 09/09/2019  Medication Sig  . albuterol (PROVENTIL HFA;VENTOLIN HFA) 108 (90 Base) MCG/ACT inhaler Inhale 2 puffs into the lungs every 6 (six) hours as needed for wheezing or shortness of breath.  . levothyroxine (SYNTHROID) 75 MCG tablet Take 1 tablet (75 mcg total) by mouth daily before breakfast.  . lisinopril-hydrochlorothiazide (ZESTORETIC) 10-12.5 MG tablet TAKE 1 TABLET BY MOUTH EVERY DAY  . metFORMIN (GLUCOPHAGE) 500 MG tablet Take 1 tablet (500 mg total) by mouth 2 (two) times daily with a meal.  . Omega-3 Fatty Acids (FISH OIL) 1000 MG CAPS Take 1,000 mg by mouth daily.   . rosuvastatin (CRESTOR) 10 MG tablet Take 1  tablet (10 mg total) by mouth daily.  . [DISCONTINUED] metFORMIN (GLUCOPHAGE) 500 MG tablet Take 1 tablet (500 mg total) by mouth daily with breakfast.   No facility-administered encounter medications on file as of 09/09/2019.   ALLERGIES: Allergies  Allergen Reactions  . Penicillins Hives    Has patient had a PCN reaction causing immediate rash, facial/tongue/throat swelling, SOB or lightheadedness with hypotension: Yes Has patient had a PCN reaction causing severe rash involving mucus membranes or skin necrosis: No Has patient had a PCN reaction that required hospitalization: Yes Has patient had a PCN reaction occurring within the last 10 years: No If all of the above answers are "NO", then may proceed with Cephalosporin use.  . Sugar-Protein-Starch     Artificial sweeteners: give her a headache     VACCINATION STATUS:  There is no immunization history on file for this patient.  HPI 58 year old female patient with medical history as above. She is being engaged in telehealth via telephone in follow-up for hypothyroidism, type 2 diabetes, hyperlipidemia, hypertension.  - Her history includes partial left hemithyroidectomy in 2000 for nodular goiter.  -She is currently on levothyroxine 75 mcg p.o. every morning.  She reports better compliance to this medication. She has no new complaints today. -She has lost 4 pounds since last visit.  -  She denies palpitations, tremors, heat/cold intolerance.   - she has no new complaints today.  She denies any hypoglycemia.  - She denies dysphagia or shortness of breath. - She also has type 2 diabetes with recent A1c of 6.6% increasing from 5.8%.   She remains on metformin 500 mg p.o. daily.    - She has dyslipidemia on low-dose Crestor.   Review of Systems  Limited as above  Objective:    There were no vitals taken for this visit.  Wt Readings from Last 3 Encounters:  03/05/19 227 lb 12.8 oz (103.3 kg)  07/26/18 223 lb (101.2 kg)  02/07/18 215 lb (97.5 kg)    Physical Exam  Recent Results (from the past 2160 hour(s))  Hemoglobin A1c     Status: Abnormal   Collection Time: 09/03/19  7:25 AM  Result Value Ref Range   Hgb A1c MFr Bld 6.6 (H) <5.7 % of total Hgb    Comment: For someone without known diabetes, a hemoglobin A1c value of 6.5% or greater indicates that they may have  diabetes and this should be confirmed with a follow-up  test. . For someone with known diabetes, a value <7% indicates  that their diabetes is well controlled and a value  greater than or equal to 7% indicates suboptimal  control. A1c targets should be individualized based on  duration of diabetes, age, comorbid conditions, and  other considerations. . Currently, no consensus exists regarding use of hemoglobin A1c for diagnosis of diabetes for  children. .    Mean Plasma Glucose 143 (calc)   eAG (mmol/L) 7.9 (calc)  TSH     Status: None   Collection Time: 09/03/19  7:25 AM  Result Value Ref Range   TSH 1.35 0.40 - 4.50 mIU/L  T4, free     Status: None   Collection Time: 09/03/19  7:25 AM  Result Value Ref Range   Free T4 1.2 0.8 - 1.8 ng/dL      Assessment & Plan:   1. Hypothyroidism- postsurgical -Her thyroid function tests are consistent with appropriate replacement. She is advised to continue levothyroxine 75 mcg p.o. daily before breakfast.   -  We discussed about the correct intake of her thyroid hormone, on empty stomach at fasting, with water, separated by at least 30 minutes from breakfast and other medications,  and separated by more than 4 hours from calcium, iron, multivitamins, acid reflux medications (PPIs). -Patient is made aware of the fact that thyroid hormone replacement is needed for life, dose to be adjusted by periodic monitoring of thyroid function tests.   2. Controlled type 2 diabetes mellitus   - This patient has significant metabolic syndrome including type 2 diabetes, dyslipidemia, hypertension, obesity, relative sedentary life. -Her previsit labs show A1c of 6.6%, increasing slowly from 5.8%.    She will continue to benefit from metformin treatment. She is advised to increase her Metformin to 500 mg p.o. twice daily-daily after breakfast and after supper.   She is advised to continue metformin 500 mg p.o. daily after breakfast.    - she  admits there is a room for improvement in her diet and drink choices. -  Suggestion is made for her to avoid simple carbohydrates  from her diet including Cakes, Sweet Desserts / Pastries, Ice Cream, Soda (diet and regular), Sweet Tea, Candies, Chips, Cookies, Sweet Pastries,  Store Bought Juices, Alcohol in Excess of  1-2 drinks a day, Artificial Sweeteners, Coffee Creamer, and "Sugar-free" Products. This will help patient to have stable blood glucose profile  and potentially avoid unintended weight gain.   3. Nodular goiter -Prior to her last visit,  thyroid/neck ultrasound showed surgically absent left lobe and isthmus, right lobe 5.7 centimeters slightly enlarged. She has no discrete nodules.  -She will not require any intervention at this time.  4. Hypertension: she is advised to home monitor blood pressure and report if > 140/90 on 2 separate readings.  She is advised to continue  lisinopril/HCTZ 10/12.5 mg p.o. daily.  I advised her on salt restriction, needs follow-up.    5. Hyperlipidemia: Controlled LDL 66 improving from 129.  She has been more consistent taking her Crestor.  She is advised to continue Crestor 10 mg p.o. daily at bedtime.  She will be considered for a lower dose on subsequent visits.   She is advised on precautions and side effects.  She is also continue on fish oil.  - I advised patient to maintain close follow up with Richarda BladeElliott, Dianne E for primary care needs. - Patient Care Time Today:  25 min, of which >50% was spent in  counseling and the rest reviewing her  current and  previous labs/studies, previous treatments, her blood glucose readings, and medications' doses and developing a plan for long-term care based on the latest recommendations for standards of care.   Rinaldo RatelAlice Coleman Kolek participated in the discussions, expressed understanding, and voiced agreement with the above plans.  All questions were answered to her satisfaction. she is encouraged to contact clinic should she have any questions or concerns prior to her return visit.   Follow up plan: Return in about 4 months (around 01/08/2020) for Follow up with Pre-visit Labs, Next Visit A1c in Office.  Marquis LunchGebre Diquan Kassis, MD Phone: [phone]  Fax: [phone]  -  This note was partially dictated with voice recognition software. Similar sounding words can be transcribed inadequately or may not  be corrected upon review.  09/09/2019, 5:03 PM

## 2019-12-16 ENCOUNTER — Other Ambulatory Visit: Payer: Self-pay | Admitting: "Endocrinology

## 2019-12-26 ENCOUNTER — Other Ambulatory Visit: Payer: Self-pay | Admitting: "Endocrinology

## 2020-01-02 LAB — COMPLETE METABOLIC PANEL WITH GFR
AG Ratio: 1.5 (calc) (ref 1.0–2.5)
ALT: 13 U/L (ref 6–29)
AST: 24 U/L (ref 10–35)
Albumin: 4.4 g/dL (ref 3.6–5.1)
Alkaline phosphatase (APISO): 53 U/L (ref 37–153)
BUN: 10 mg/dL (ref 7–25)
CO2: 30 mmol/L (ref 20–32)
Calcium: 9.9 mg/dL (ref 8.6–10.4)
Chloride: 103 mmol/L (ref 98–110)
Creat: 0.84 mg/dL (ref 0.50–0.99)
GFR, Est African American: 88 mL/min/{1.73_m2} (ref >=60)
GFR, Est Non African American: 76 mL/min/{1.73_m2} (ref >=60)
Globulin: 3 g/dL (calc) (ref 1.9–3.7)
Glucose, Bld: 109 mg/dL — ABNORMAL HIGH (ref 65–99)
Potassium: 4.4 mmol/L (ref 3.5–5.3)
Sodium: 139 mmol/L (ref 135–146)
Total Bilirubin: 0.5 mg/dL (ref 0.2–1.2)
Total Protein: 7.4 g/dL (ref 6.1–8.1)

## 2020-01-02 LAB — LIPID PANEL
Cholesterol: 118 mg/dL (ref <200)
HDL: 38 mg/dL — ABNORMAL LOW (ref >=50)
LDL Cholesterol (Calc): 60 mg/dL (calc)
Non-HDL Cholesterol (Calc): 80 mg/dL (calc) (ref <130)
Total CHOL/HDL Ratio: 3.1 (calc) (ref <5.0)
Triglycerides: 123 mg/dL (ref <150)

## 2020-01-02 LAB — T4, FREE: Free T4: 1.4 ng/dL (ref 0.8–1.8)

## 2020-01-02 LAB — VITAMIN D 25 HYDROXY (VIT D DEFICIENCY, FRACTURES): Vit D, 25-Hydroxy: 13 ng/mL — ABNORMAL LOW (ref 30–100)

## 2020-01-02 LAB — TSH: TSH: 1.38 mIU/L (ref 0.40–4.50)

## 2020-01-08 ENCOUNTER — Ambulatory Visit: Payer: BC Managed Care – PPO | Admitting: "Endocrinology

## 2020-01-09 ENCOUNTER — Encounter: Payer: Self-pay | Admitting: "Endocrinology

## 2020-01-09 ENCOUNTER — Ambulatory Visit (INDEPENDENT_AMBULATORY_CARE_PROVIDER_SITE_OTHER): Payer: BC Managed Care – PPO | Admitting: "Endocrinology

## 2020-01-09 ENCOUNTER — Other Ambulatory Visit: Payer: Self-pay

## 2020-01-09 VITALS — BP 125/82 | HR 65 | Ht 67.0 in | Wt 222.0 lb

## 2020-01-09 DIAGNOSIS — E119 Type 2 diabetes mellitus without complications: Secondary | ICD-10-CM

## 2020-01-09 DIAGNOSIS — I10 Essential (primary) hypertension: Secondary | ICD-10-CM

## 2020-01-09 DIAGNOSIS — E039 Hypothyroidism, unspecified: Secondary | ICD-10-CM

## 2020-01-09 DIAGNOSIS — E782 Mixed hyperlipidemia: Secondary | ICD-10-CM

## 2020-01-09 DIAGNOSIS — E559 Vitamin D deficiency, unspecified: Secondary | ICD-10-CM

## 2020-01-09 LAB — POCT GLYCOSYLATED HEMOGLOBIN (HGB A1C): Hemoglobin A1C: 6 % — AB (ref 4.0–5.6)

## 2020-01-09 MED ORDER — VITAMIN D (ERGOCALCIFEROL) 1.25 MG (50000 UNIT) PO CAPS: 50000.0000 [IU] | ORAL_CAPSULE | ORAL | 0 refills | Status: DC

## 2020-01-09 NOTE — Progress Notes (Signed)
01/09/2020  Endocrinology follow-up note  Subjective:    Patient ID: Denise Oneill, female    DOB: 31-Dec-1960, PCP Garald Braver   Past Medical History:  Diagnosis Date  . Asthma   . Diabetes mellitus without complication ([facility])   . Goiter   . Hypertension   . Hypothyroidism    Past Surgical History:  Procedure Laterality Date  . COLONOSCOPY N/A 02/07/2018   Procedure: COLONOSCOPY;  Surgeon: Malissa Hippo, MD;  Location[facility];  Service: Endoscopy;  Laterality: N/A;  830  . Partial Thyroidiectomy     Social History   Socioeconomic History  . Marital status: Married    Spouse name: Not on file  . Number of children: Not on file  . Years of education: Not on file  . Highest education level: Not on file  Occupational History  . Not on file  Tobacco Use  . Smoking status: Never Smoker  . Smokeless tobacco: Never Used  Substance and Sexual Activity  . Alcohol use: No  . Drug use: No  . Sexual activity: Not on file  Other Topics Concern  . Not on file  Social History Narrative  . Not on file   Social Determinants of Health   Financial Resource Strain:   . Difficulty of Paying Living Expenses:   Food Insecurity:   . Worried About Programme researcher, broadcasting/film/video in the Last Year:   . Barista in the Last Year:   Transportation Needs:   . Freight forwarder (Medical):   Marland Kitchen Lack of Transportation (Non-Medical):   Physical Activity:   . Days of Exercise per Week:   . Minutes of Exercise per Session:   Stress:   . Feeling of Stress :   Social Connections:   . Frequency of Communication with Friends and Family:   . Frequency of Social Gatherings with Friends and Family:   . Attends Religious Services:   . Active Member of Clubs or Organizations:   . Attends Banker Meetings:   Marland Kitchen Marital Status:    Outpatient Encounter Medications as of 01/09/2020  Medication Sig  . albuterol (PROVENTIL HFA;VENTOLIN HFA) 108 (90 Base) MCG/ACT  inhaler Inhale 2 puffs into the lungs every 6 (six) hours as needed for wheezing or shortness of breath.  . levothyroxine (SYNTHROID) 75 MCG tablet TAKE 1 TABLET BY MOUTH EVERY DAY BEFORE BREAKFAST  . lisinopril-hydrochlorothiazide (ZESTORETIC) 10-12.5 MG tablet TAKE 1 TABLET BY MOUTH EVERY DAY  . metFORMIN (GLUCOPHAGE) 500 MG tablet Take 1 tablet (500 mg total) by mouth 2 (two) times daily with a meal.  . rosuvastatin (CRESTOR) 10 MG tablet Take 1 tablet (10 mg total) by mouth daily.  . Vitamin D, Ergocalciferol, (DRISDOL) 1.25 MG (50000 UNIT) CAPS capsule Take 1 capsule (50,000 Units total) by mouth every 7 (seven) days.  . [DISCONTINUED] Omega-3 Fatty Acids (FISH OIL) 1000 MG CAPS Take 1,000 mg by mouth daily.    No facility-administered encounter medications on file as of 01/09/2020.   ALLERGIES: Allergies  Allergen Reactions  . Penicillins Hives    Has patient had a PCN reaction causing immediate rash, facial/tongue/throat swelling, SOB or lightheadedness with hypotension: Yes Has patient had a PCN reaction causing severe rash involving mucus membranes or skin necrosis: No Has patient had a PCN reaction that required hospitalization: Yes Has patient had a PCN reaction occurring within the last 10 years: No If all of the above answers are "NO", then may  proceed with Cephalosporin use.  . Sugar-Protein-Starch     Artificial sweeteners: give her a headache    VACCINATION STATUS:  There is no immunization history on file for this patient.  HPI 59 year old female patient with medical history as above. She is being engaged in telehealth via telephone in follow-up for hypothyroidism, type 2 diabetes, hyperlipidemia, hypertension.  - Her history includes partial left hemithyroidectomy in 2000 for nodular goiter.  -She is currently on levothyroxine 75 mcg p.o. every morning.  She reports better compliance to this medication. She has no new complaints today. -She has lost 5 she denies  palpitations, tremors, heat/cold intolerance.    - she has no new complaints today.  She denies any hypoglycemia.  - She denies dysphagia or shortness of breath. - She also has type 2 diabetes with point-of-care A1c of 6%, improving from 6.6%.  She remains on Metformin 500 mg p.o. twice daily.   - She has dyslipidemia on low-dose Crestor.   Review of Systems   Review of systems  Constitutional: + Minimally fluctuating body weight,  current  Body mass index is 34.77 kg/m. , no fatigue, no subjective hyperthermia, no subjective hypothermia Eyes: no blurry vision, no xerophthalmia ENT: no sore throat, no nodules palpated in throat, no dysphagia/odynophagia, no hoarseness Cardiovascular: no Chest Pain, no Shortness of Breath, no palpitations, no leg swelling Respiratory: no cough, no shortness of breath Gastrointestinal: no Nausea/Vomiting/Diarhhea Musculoskeletal: no muscle/joint aches Skin: no rashes, no hyperemia Neurological: no tremors, no numbness, no tingling, no dizziness Psychiatric: no depression, no anxiety  Limited as above  Objective:    BP 125/82   Pulse 65   Ht 5\' 7"  (1.702 m)   Wt 222 lb (100.7 kg)   BMI 34.77 kg/m   Wt Readings from Last 3 Encounters:  01/09/20 222 lb (100.7 kg)  03/05/19 227 lb 12.8 oz (103.3 kg)  07/26/18 223 lb (101.2 kg)    Physical Exam   Physical Exam- Limited  Constitutional:  Body mass index is 34.77 kg/m. , not in acute distress, normal state of mind Eyes:  EOMI, no exophthalmos Neck: Supple Thyroid: + Scar from partial thyroidectomy in the remote past.   Respiratory: Adequate breathing efforts Musculoskeletal: no gross deformities, strength intact in all four extremities, no gross restriction of joint movements Skin:  no rashes, no hyperemia Neurological: no tremor with outstretched hands,   Recent Results (from the past 2160 hour(s))  Lipid Panel     Status: Abnormal   Collection Time: 01/02/20  8:15 AM  Result Value  Ref Range   Cholesterol 118 <200 mg/dL   HDL 38 (L) > OR = 50 mg/dL   Triglycerides 01/04/20 235 mg/dL   LDL Cholesterol (Calc) 60 mg/dL (calc)    Comment: Reference range: <100 . Desirable range <100 mg/dL for primary prevention;   <70 mg/dL for patients with CHD or diabetic patients  with > or = 2 CHD risk factors. <573 LDL-C is now calculated using the Martin-Hopkins  calculation, which is a validated novel method providing  better accuracy than the Friedewald equation in the  estimation of LDL-C.  Marland Kitchen et al. Horald Pollen. Lenox Ahr): 2061-2068  (http://education.[facility].com/faq/FAQ164)    Total CHOL/HDL Ratio 3.1 <5.0 (calc)   Non-HDL Cholesterol (Calc) 80 05[birth year]-1971 mg/dL (calc)    Comment: For patients with diabetes plus 1 major ASCVD risk  factor, treating to a non-HDL-C goal of <100 mg/dL  (LDL-C of <623 mg/dL) is considered a therapeutic  option.   COMPLETE  METABOLIC PANEL WITH GFR     Status: Abnormal   Collection Time: 01/02/20  8:15 AM  Result Value Ref Range   Glucose, Bld 109 (H) 65 - 99 mg/dL    Comment: .            Fasting reference interval . For someone without known diabetes, a glucose value between 100 and 125 mg/dL is consistent with prediabetes and should be confirmed with a follow-up test. .    BUN 10 7 - 25 mg/dL   Creat 0.92 3.30 - 0.76 mg/dL    Comment: For patients >44 years of age, the reference limit for Creatinine is approximately 13% higher for people identified as African-American. .    GFR, Est Non African American 76 > OR = 60 mL/min/1.28m2   GFR, Est African American 88 > OR = 60 mL/min/1.43m2   BUN/Creatinine Ratio NOT APPLICABLE 6 - 22 (calc)   Sodium 139 135 - 146 mmol/L   Potassium 4.4 3.5 - 5.3 mmol/L   Chloride 103 98 - 110 mmol/L   CO2 30 20 - 32 mmol/L   Calcium 9.9 8.6 - 10.4 mg/dL   Total Protein 7.4 6.1 - 8.1 g/dL   Albumin 4.4 3.6 - 5.1 g/dL   Globulin 3.0 1.9 - 3.7 g/dL (calc)   AG Ratio 1.5 1.0 - 2.5 (calc)   Total  Bilirubin 0.5 0.2 - 1.2 mg/dL   Alkaline phosphatase (APISO) 53 37 - 153 U/L   AST 24 10 - 35 U/L   ALT 13 6 - 29 U/L  TSH SOLSTAS     Status: None   Collection Time: 01/02/20  8:15 AM  Result Value Ref Range   TSH 1.38 0.40 - 4.50 mIU/L  T4, free SOLSTAS     Status: None   Collection Time: 01/02/20  8:15 AM  Result Value Ref Range   Free T4 1.4 0.8 - 1.8 ng/dL  VITAMIN D 25 Hydroxy (Vit-D Deficiency, Fractures)     Status: Abnormal   Collection Time: 01/02/20  8:15 AM  Result Value Ref Range   Vit D, 25-Hydroxy 13 (L) 30 - 100 ng/mL    Comment: Vitamin D Status         25-OH Vitamin D: . Deficiency:                    <20 ng/mL Insufficiency:             20 - 29 ng/mL Optimal:                 > or = 30 ng/mL . For 25-OH Vitamin D testing on patients on  D2-supplementation and patients for whom quantitation  of D2 and D3 fractions is required, the [facility](TM) 25-OH VIT D, (D2,D3), LC/MS/MS is recommended: order  code 22633 (patients >59yrs). See Note 1 . Note 1 . For additional information, please refer to  http://education.[facility].com/faq/FAQ199  (This link is being provided for informational/ educational purposes only.)   HgB A1c     Status: Abnormal   Collection Time: 01/09/20  2:46 PM  Result Value Ref Range   Hemoglobin A1C 6.0 (A) 4.0 - 5.6 %   HbA1c POC (<> result, manual entry)     HbA1c, POC (prediabetic range)     HbA1c, POC (controlled diabetic range)        Assessment & Plan:   1. Hypothyroidism- postsurgical -Her previsit thyroid function tests are consistent with appropriate replacement.  She is advised  to continue levothyroxine 75 mcg p.o. daily before breakfast.    - We discussed about the correct intake of her thyroid hormone, on empty stomach at fasting, with water, separated by at least 30 minutes from breakfast and other medications,  and separated by more than 4 hours from calcium, iron, multivitamins, acid reflux medications  (PPIs). -Patient is made aware of the fact that thyroid hormone replacement is needed for life, dose to be adjusted by periodic monitoring of thyroid function tests.   2. Controlled type 2 diabetes mellitus   - This patient has significant metabolic syndrome including type 2 diabetes, dyslipidemia, hypertension, obesity, relative sedentary life. -Her point-of-care A1c 6%, improving from 6.6 %. -She will continue to benefit from low-dose Metformin.  She is advised to continue Metformin 500 mg p.o. daily at breakfast.  - she  admits there is a room for improvement in her diet and drink choices. -  Suggestion is made for her to avoid simple carbohydrates  from her diet including Cakes, Sweet Desserts / Pastries, Ice Cream, Soda (diet and regular), Sweet Tea, Candies, Chips, Cookies, Sweet Pastries,  Store Bought Juices, Alcohol in Excess of  1-2 drinks a day, Artificial Sweeteners, Coffee Creamer, and "Sugar-free" Products. This will help patient to have stable blood glucose profile and potentially avoid unintended weight gain.   3. Nodular goiter -Prior to her last visit,  thyroid/neck ultrasound showed surgically absent left lobe and isthmus, right lobe 5.7 centimeters slightly enlarged. She has no discrete nodules.  -She will not require any intervention at this time.  4. Hypertension:  Her blood pressure is controlled to target.  She is advised to continue  lisinopril/HCTZ 10/12.5 mg p.o. daily.  I advised her on salt restriction, needs follow-up.    5. Hyperlipidemia: Her previsit labs show controlled LDL at 60, overall improving from 133.  She has tolerated and benefited from Crestor therapy.  She is advised to continue Crestor 10 mg p.o. daily at bedtime.  She will be considered for a lower dose on subsequent visits.   She is advised on precautions and side effects.  She is also continue on fish oil.  6) vitamin D deficiency-new diagnosis for her. I discussed initiated vitamin D2  50,000 units weekly.  - I advised patient to maintain close follow up with Richarda BladeElliott, Dianne E for primary care needs.  - Time spent on this patient care encounter:  35 min, of which > 50% was spent in  counseling and the rest reviewing her blood glucose logs , discussing her hypoglycemia and hyperglycemia episodes, reviewing her current and  previous labs / studies  ( including abstraction from other facilities) and medications  doses and developing a  long term treatment plan and documenting her care.   Please refer to Patient Instructions for Blood Glucose Monitoring and Insulin/Medications Dosing Guide"  in media tab for additional information. Please  also refer to " Patient Self Inventory" in the Media  tab for reviewed elements of pertinent patient history.  Denise RatelAlice Coleman Wank participated in the discussions, expressed understanding, and voiced agreement with the above plans.  All questions were answered to her satisfaction. she is encouraged to contact clinic should she have any questions or concerns prior to her return visit.    Follow up plan: Return in about 6 months (around 07/10/2020) for Follow up with Pre-visit Labs, Next Visit A1c in Office.  Marquis LunchGebre Devonne Kitchen, MD Phone: [phone]  Fax: [phone]  -  This note was partially dictated with  voice recognition software. Similar sounding words can be transcribed inadequately or may not  be corrected upon review.  01/09/2020, 5:02 PM

## 2020-01-09 NOTE — Patient Instructions (Signed)

## 2020-03-20 ENCOUNTER — Other Ambulatory Visit: Payer: Self-pay | Admitting: "Endocrinology

## 2020-03-27 ENCOUNTER — Other Ambulatory Visit: Payer: Self-pay | Admitting: "Endocrinology

## 2020-04-30 ENCOUNTER — Other Ambulatory Visit: Payer: Self-pay | Admitting: "Endocrinology

## 2020-06-07 ENCOUNTER — Other Ambulatory Visit: Payer: Self-pay | Admitting: "Endocrinology

## 2020-06-14 ENCOUNTER — Other Ambulatory Visit: Payer: Self-pay | Admitting: "Endocrinology

## 2020-06-28 ENCOUNTER — Other Ambulatory Visit: Payer: Self-pay | Admitting: "Endocrinology

## 2020-07-09 LAB — COMPLETE METABOLIC PANEL WITH GFR
AG Ratio: 1.8 (calc) (ref 1.0–2.5)
ALT: 13 U/L (ref 6–29)
AST: 20 U/L (ref 10–35)
Albumin: 4.2 g/dL (ref 3.6–5.1)
Alkaline phosphatase (APISO): 54 U/L (ref 37–153)
BUN: 12 mg/dL (ref 7–25)
CO2: 29 mmol/L (ref 20–32)
Calcium: 9.6 mg/dL (ref 8.6–10.4)
Chloride: 103 mmol/L (ref 98–110)
Creat: 0.79 mg/dL (ref 0.50–0.99)
GFR, Est African American: 94 mL/min/{1.73_m2} (ref >=60)
GFR, Est Non African American: 81 mL/min/{1.73_m2} (ref >=60)
Globulin: 2.3 g/dL (calc) (ref 1.9–3.7)
Glucose, Bld: 112 mg/dL — ABNORMAL HIGH (ref 65–99)
Potassium: 4.3 mmol/L (ref 3.5–5.3)
Sodium: 138 mmol/L (ref 135–146)
Total Bilirubin: 0.3 mg/dL (ref 0.2–1.2)
Total Protein: 6.5 g/dL (ref 6.1–8.1)

## 2020-07-09 LAB — MICROALBUMIN / CREATININE URINE RATIO
Creatinine, Urine: 218 mg/dL (ref 20–275)
Microalb Creat Ratio: 3 mcg/mg creat (ref <30)
Microalb, Ur: 0.6 mg/dL

## 2020-07-09 LAB — LIPID PANEL
Cholesterol: 128 mg/dL (ref <200)
HDL: 42 mg/dL — ABNORMAL LOW (ref >=50)
LDL Cholesterol (Calc): 67 mg/dL (calc)
Non-HDL Cholesterol (Calc): 86 mg/dL (calc) (ref <130)
Total CHOL/HDL Ratio: 3 (calc) (ref <5.0)
Triglycerides: 109 mg/dL (ref <150)

## 2020-07-09 LAB — VITAMIN D 25 HYDROXY (VIT D DEFICIENCY, FRACTURES): Vit D, 25-Hydroxy: 47 ng/mL (ref 30–100)

## 2020-07-09 LAB — TSH: TSH: 0.92 mIU/L (ref 0.40–4.50)

## 2020-07-09 LAB — T4, FREE: Free T4: 1.1 ng/dL (ref 0.8–1.8)

## 2020-07-15 ENCOUNTER — Other Ambulatory Visit: Payer: Self-pay

## 2020-07-15 ENCOUNTER — Encounter: Payer: Self-pay | Admitting: Nurse Practitioner

## 2020-07-15 ENCOUNTER — Ambulatory Visit (INDEPENDENT_AMBULATORY_CARE_PROVIDER_SITE_OTHER): Payer: BC Managed Care – PPO | Admitting: Nurse Practitioner

## 2020-07-15 VITALS — BP 123/79 | HR 74 | Ht 67.0 in | Wt 225.0 lb

## 2020-07-15 DIAGNOSIS — E782 Mixed hyperlipidemia: Secondary | ICD-10-CM | POA: Diagnosis not present

## 2020-07-15 DIAGNOSIS — E039 Hypothyroidism, unspecified: Secondary | ICD-10-CM | POA: Diagnosis not present

## 2020-07-15 DIAGNOSIS — I10 Essential (primary) hypertension: Secondary | ICD-10-CM

## 2020-07-15 DIAGNOSIS — E119 Type 2 diabetes mellitus without complications: Secondary | ICD-10-CM

## 2020-07-15 DIAGNOSIS — E559 Vitamin D deficiency, unspecified: Secondary | ICD-10-CM

## 2020-07-15 LAB — POCT GLYCOSYLATED HEMOGLOBIN (HGB A1C): Hemoglobin A1C: 6.3 % — AB (ref 4.0–5.6)

## 2020-07-15 NOTE — Progress Notes (Signed)
07/15/2020  Endocrinology follow-up note  Subjective:    Patient ID: Denise RatelAlice Coleman Oneill, female    DOB: 03-05-1961, PCP Garald BraverElliott, Dianne E   Past Medical History:  Diagnosis Date  . Asthma   . Diabetes mellitus without complication (HCC)   . Goiter   . Hypertension   . Hypothyroidism    Past Surgical History:  Procedure Laterality Date  . COLONOSCOPY N/A 02/07/2018   Procedure: COLONOSCOPY;  Surgeon: Malissa Hippoehman, Najeeb U, MD;  Location: [facility];  Service: Endoscopy;  Laterality: N/A;  830  . Partial Thyroidiectomy     Social History   Socioeconomic History  . Marital status: Married    Spouse name: Not on file  . Number of children: Not on file  . Years of education: Not on file  . Highest education level: Not on file  Occupational History  . Not on file  Tobacco Use  . Smoking status: Never Smoker  . Smokeless tobacco: Never Used  Vaping Use  . Vaping Use: Never used  Substance and Sexual Activity  . Alcohol use: No  . Drug use: No  . Sexual activity: Not on file  Other Topics Concern  . Not on file  Social History Narrative  . Not on file   Social Determinants of Health   Financial Resource Strain:   . Difficulty of Paying Living Expenses: Not on file  Food Insecurity:   . Worried About Programme researcher, broadcasting/film/videounning Out of Food in the Last Year: Not on file  . Ran Out of Food in the Last Year: Not on file  Transportation Needs:   . Lack of Transportation (Medical): Not on file  . Lack of Transportation (Non-Medical): Not on file  Physical Activity:   . Days of Exercise per Week: Not on file  . Minutes of Exercise per Session: Not on file  Stress:   . Feeling of Stress : Not on file  Social Connections:   . Frequency of Communication with Friends and Family: Not on file  . Frequency of Social Gatherings with Friends and Family: Not on file  . Attends Religious Services: Not on file  . Active Member of Clubs or Organizations: Not on file  . Attends Tax inspectorClub or  Organization Meetings: Not on file  . Marital Status: Not on file   Outpatient Encounter Medications as of 07/15/2020  Medication Sig  . albuterol (PROVENTIL HFA;VENTOLIN HFA) 108 (90 Base) MCG/ACT inhaler Inhale 2 puffs into the lungs every 6 (six) hours as needed for wheezing or shortness of breath.  . anastrozole (ARIMIDEX) 1 MG tablet Take 1 mg by mouth daily.  Marland Kitchen. levothyroxine (SYNTHROID) 75 MCG tablet TAKE 1 TABLET BY MOUTH EVERY DAY BEFORE BREAKFAST  . lisinopril-hydrochlorothiazide (ZESTORETIC) 10-12.5 MG tablet TAKE 1 TABLET BY MOUTH EVERY DAY  . metFORMIN (GLUCOPHAGE) 500 MG tablet Take 1 tablet (500 mg total) by mouth daily with breakfast. Take one tablet daily with breakfast  . rosuvastatin (CRESTOR) 10 MG tablet TAKE 1 TABLET BY MOUTH EVERY DAY  . Vitamin D, Ergocalciferol, (DRISDOL) 1.25 MG (50000 UNIT) CAPS capsule TAKE 1 CAPSULE (50,000 UNITS TOTAL) BY MOUTH EVERY 7 (SEVEN) DAYS.   No facility-administered encounter medications on file as of 07/15/2020.   ALLERGIES: Allergies  Allergen Reactions  . Penicillins Hives    Has patient had a PCN reaction causing immediate rash, facial/tongue/throat swelling, SOB or lightheadedness with hypotension: Yes Has patient had a PCN reaction causing severe rash involving mucus membranes or skin necrosis: No Has  patient had a PCN reaction that required hospitalization: Yes Has patient had a PCN reaction occurring within the last 10 years: No If all of the above answers are "NO", then may proceed with Cephalosporin use.  . Sugar-Protein-Starch     Artificial sweeteners: give her a headache    VACCINATION STATUS:  There is no immunization history on file for this patient.  Diabetes She presents for her follow-up diabetic visit. She has type 2 diabetes mellitus. Her disease course has been stable. There are no hypoglycemic associated symptoms. Pertinent negatives for hypoglycemia include no nervousness/anxiousness or tremors. Pertinent  negatives for diabetes include no blurred vision, no chest pain, no fatigue, no polydipsia, no polyphagia, no polyuria and no weight loss. There are no hypoglycemic complications. Symptoms are stable. Risk factors for coronary artery disease include diabetes mellitus, dyslipidemia, obesity, hypertension and sedentary lifestyle. Current diabetic treatment includes oral agent (monotherapy). She is compliant with treatment most of the time. Her weight is fluctuating minimally. She is following a generally unhealthy diet. When asked about meal planning, she reported none. She has not had a previous visit with a dietitian. She participates in exercise intermittently. (She presents today with no meter or logs to review.  She does not routinely monitor glucose due to monotherapy of Metformin only.  Her POCT A1C today is 6.3%, essentially unchanged from previous visit of 6.0%.  She denies any s/s of hypoglycemia.) An ACE inhibitor/angiotensin II receptor blocker is being taken. She does not see a podiatrist.Eye exam is current.  Thyroid Problem Presents for follow-up (- Her history includes partial left hemithyroidectomy in 2000 for nodular goiter. ) visit. Patient reports no anxiety, cold intolerance, constipation, depressed mood, diarrhea, fatigue, palpitations, tremors or weight loss. The symptoms have been stable. Her past medical history is significant for hyperlipidemia.  Hyperlipidemia This is a chronic problem. The current episode started more than 1 year ago. The problem is controlled. Recent lipid tests were reviewed and are normal. Exacerbating diseases include hypothyroidism and obesity. Factors aggravating her hyperlipidemia include thiazides. Pertinent negatives include no chest pain. Current antihyperlipidemic treatment includes statins. The current treatment provides moderate improvement of lipids. There are no compliance problems.  Risk factors for coronary artery disease include diabetes mellitus,  dyslipidemia, hypertension, obesity and a sedentary lifestyle.  Hypertension This is a chronic problem. The problem has been gradually improving since onset. The problem is controlled. Pertinent negatives include no blurred vision, chest pain or palpitations. Agents associated with hypertension include thyroid hormones. Risk factors for coronary artery disease include diabetes mellitus, dyslipidemia, obesity and sedentary lifestyle. Past treatments include ACE inhibitors and diuretics. The current treatment provides moderate improvement. There are no compliance problems.  Identifiable causes of hypertension include a thyroid problem.    Review of systems  Constitutional: + Minimally fluctuating body weight,  current Body mass index is 35.24 kg/m. , no fatigue, no subjective hyperthermia, no subjective hypothermia Eyes: no blurry vision, no xerophthalmia ENT: no sore throat, no nodules palpated in throat, no dysphagia/odynophagia, no hoarseness Cardiovascular: no chest pain, no shortness of breath, no palpitations, no leg swelling Respiratory: no cough, no shortness of breath Gastrointestinal: no nausea/vomiting/diarrhea Musculoskeletal: no muscle/joint aches Skin: no rashes, no hyperemia Neurological: no tremors, no numbness, no tingling, no dizziness Psychiatric: no depression, no anxiety  Objective:    BP 123/79 (BP Location: Left Arm, Patient Position: Sitting)   Pulse 74   Ht 5\' 7"  (1.702 m)   Wt 225 lb (102.1 kg)   BMI 35.24  kg/m   Wt Readings from Last 3 Encounters:  07/15/20 225 lb (102.1 kg)  01/09/20 222 lb (100.7 kg)  03/05/19 227 lb 12.8 oz (103.3 kg)    BP Readings from Last 3 Encounters:  07/15/20 123/79  01/09/20 125/82  03/05/19 131/82     Physical Exam- Limited  Constitutional:  Body mass index is 35.24 kg/m. , not in acute distress, normal state of mind Eyes:  EOMI, no exophthalmos Neck: Supple Thyroid: No gross goiter, + Scar from partial thyroidectomy  in the past Cardiovascular: RRR, no murmers, rubs, or gallops, no edema Respiratory: Adequate breathing efforts, no crackles, rales, rhonchi, or wheezing Musculoskeletal: no gross deformities, strength intact in all four extremities, no gross restriction of joint movements Skin:  no rashes, no hyperemia Neurological: no tremor with outstretched hands  Recent Results (from the past 2160 hour(s))  COMPLETE METABOLIC PANEL WITH GFR     Status: Abnormal   Collection Time: 07/08/20 10:45 AM  Result Value Ref Range   Glucose, Bld 112 (H) 65 - 99 mg/dL    Comment: .            Fasting reference interval . For someone without known diabetes, a glucose value between 100 and 125 mg/dL is consistent with prediabetes and should be confirmed with a follow-up test. .    BUN 12 7 - 25 mg/dL   Creat 4.19 3.79 - 0.24 mg/dL    Comment: For patients >70 years of age, the reference limit for Creatinine is approximately 13% higher for people identified as African-American. .    GFR, Est Non African American 81 > OR = 60 mL/min/1.47m2   GFR, Est African American 94 > OR = 60 mL/min/1.20m2   BUN/Creatinine Ratio NOT APPLICABLE 6 - 22 (calc)   Sodium 138 135 - 146 mmol/L   Potassium 4.3 3.5 - 5.3 mmol/L   Chloride 103 98 - 110 mmol/L   CO2 29 20 - 32 mmol/L   Calcium 9.6 8.6 - 10.4 mg/dL   Total Protein 6.5 6.1 - 8.1 g/dL   Albumin 4.2 3.6 - 5.1 g/dL   Globulin 2.3 1.9 - 3.7 g/dL (calc)   AG Ratio 1.8 1.0 - 2.5 (calc)   Total Bilirubin 0.3 0.2 - 1.2 mg/dL   Alkaline phosphatase (APISO) 54 37 - 153 U/L   AST 20 10 - 35 U/L   ALT 13 6 - 29 U/L  T4, free     Status: None   Collection Time: 07/08/20 10:45 AM  Result Value Ref Range   Free T4 1.1 0.8 - 1.8 ng/dL  TSH     Status: None   Collection Time: 07/08/20 10:45 AM  Result Value Ref Range   TSH 0.92 0.40 - 4.50 mIU/L  Lipid panel     Status: Abnormal   Collection Time: 07/08/20 10:45 AM  Result Value Ref Range   Cholesterol 128 <200  mg/dL   HDL 42 (L) > OR = 50 mg/dL   Triglycerides 097 <353 mg/dL   LDL Cholesterol (Calc) 67 mg/dL (calc)    Comment: Reference range: <100 . Desirable range <100 mg/dL for primary prevention;   <70 mg/dL for patients with CHD or diabetic patients  with > or = 2 CHD risk factors. Marland Kitchen LDL-C is now calculated using the Martin-Hopkins  calculation, which is a validated novel method providing  better accuracy than the Friedewald equation in the  estimation of LDL-C.  Horald Pollen et al. Lenox Ahr. 2992;426(83): 2061-2068  (http://education.[facility].com/faq/FAQ164)  Total CHOL/HDL Ratio 3.0 <5.0 (calc)   Non-HDL Cholesterol (Calc) 86 <630 mg/dL (calc)    Comment: For patients with diabetes plus 1 major ASCVD risk  factor, treating to a non-HDL-C goal of <100 mg/dL  (LDL-C of <16 mg/dL) is considered a therapeutic  option.   Microalbumin / creatinine urine ratio     Status: None   Collection Time: 07/08/20 10:45 AM  Result Value Ref Range   Creatinine, Urine 218 20 - 275 mg/dL   Microalb, Ur 0.6 mg/dL    Comment: Reference Range Not established    Microalb Creat Ratio 3 <30 mcg/mg creat    Comment: . The ADA defines abnormalities in albumin excretion as follows: Marland Kitchen Albuminuria Category        Result (mcg/mg creatinine) . Normal to Mildly increased   <30 Moderately increased         30-299  Severely increased           > OR = 300 . The ADA recommends that at least two of three specimens collected within a 3-6 month period be abnormal before considering a patient to be within a diagnostic category.   VITAMIN D 25 Hydroxy (Vit-D Deficiency, Fractures)     Status: None   Collection Time: 07/08/20 10:45 AM  Result Value Ref Range   Vit D, 25-Hydroxy 47 30 - 100 ng/mL    Comment: Vitamin D Status         25-OH Vitamin D: . Deficiency:                    <20 ng/mL Insufficiency:             20 - 29 ng/mL Optimal:                 > or = 30 ng/mL . For 25-OH Vitamin D  testing on patients on  D2-supplementation and patients for whom quantitation  of D2 and D3 fractions is required, the [facility](TM) 25-OH VIT D, (D2,D3), LC/MS/MS is recommended: order  code 01093 (patients >28yrs). See Note 1 . Note 1 . For additional information, please refer to  http://education.[facility].com/faq/FAQ199  (This link is being provided for informational/ educational purposes only.)   HgB A1c     Status: Abnormal   Collection Time: 07/15/20  2:02 PM  Result Value Ref Range   Hemoglobin A1C 6.3 (A) 4.0 - 5.6 %   HbA1c POC (<> result, manual entry)     HbA1c, POC (prediabetic range)     HbA1c, POC (controlled diabetic range)        Assessment & Plan:   1. Hypothyroidism- postsurgical -Her previsit thyroid function tests are consistent with appropriate hormone replacement.  She is advised to continue Levothyroxine 75 mcg po daily before breakfast.   - We discussed about the correct intake of her thyroid hormone, on empty stomach at fasting, with water, separated by at least 30 minutes from breakfast and other medications,  and separated by more than 4 hours from calcium, iron, multivitamins, acid reflux medications (PPIs). -Patient is made aware of the fact that thyroid hormone replacement is needed for life, dose to be adjusted by periodic monitoring of thyroid function tests.  2. Controlled type 2 diabetes mellitus  - This patient has significant metabolic syndrome including type 2 diabetes, dyslipidemia, hypertension, obesity, relative sedentary life.  She presents today with no meter or logs to review.  She does not routinely monitor glucose due to monotherapy of Metformin  only.  Her POCT A1C today is 6.3%, essentially unchanged from previous visit of 6.0%.  She denies any s/s of hypoglycemia.  - Nutritional counseling repeated at each appointment due to patients tendency to fall back in to old habits.  - The patient admits there is a room for  improvement in their diet and drink choices. -  Suggestion is made for the patient to avoid simple carbohydrates from their diet including Cakes, Sweet Desserts / Pastries, Ice Cream, Soda (diet and regular), Sweet Tea, Candies, Chips, Cookies, Sweet Pastries,  Store Bought Juices, Alcohol in Excess of  1-2 drinks a day, Artificial Sweeteners, Coffee Creamer, and "Sugar-free" Products. This will help patient to have stable blood glucose profile and potentially avoid unintended weight gain.   - I encouraged the patient to switch to  unprocessed or minimally processed complex starch and increased protein intake (animal or plant source), fruits, and vegetables.   - Patient is advised to stick to a routine mealtimes to eat 3 meals  a day and avoid unnecessary snacks ( to snack only to correct hypoglycemia).  -She is advised to continue Metformin 500 mg po daily with breakfast.  3. Nodular goiter -Her thyroid ultrasound from 03/23/17 shows surgically absent left thyroid lobe and isthmus and mildly enlarged right thyroid lobe without discrete nodules. -No dedicated follow up needed at this time.  4. Hypertension:  Her blood pressure is controlled to target.  She is advised to continue Lisinopril-HCT 10-12.5 mg po daily.  5. Hyperlipidemia:  Her most recent lipid panel from 07/08/20 shows controlled LDL of 67.  She is advised to continue Crestor 10 mg po daily at bedtime.  Side effects and precautions discussed with her.  6. Vitamin D deficiency: Her previsit vitamin D level was 47.  She has completed replenishment with Ergocalciferol.  She is advised to start daily maintenance dose of Vitamin D3 5000 units PO daily OTC.  - I advised patient to maintain close follow up with Richarda Blade E for primary care needs.  - Time spent on this patient care encounter:  35 min, of which > 50% was spent in  counseling and the rest reviewing her blood glucose logs , discussing her hypoglycemia and hyperglycemia  episodes, reviewing her current and  previous labs / studies  ( including abstraction from other facilities) and medications  doses and developing a  long term treatment plan and documenting her care.   Please refer to Patient Instructions for Blood Glucose Monitoring and Insulin/Medications Dosing Guide"  in media tab for additional information. Please  also refer to " Patient Self Inventory" in the Media  tab for reviewed elements of pertinent patient history.  Denise Oneill participated in the discussions, expressed understanding, and voiced agreement with the above plans.  All questions were answered to her satisfaction. she is encouraged to contact clinic should she have any questions or concerns prior to her return visit.    Follow up plan: Return in about 4 months (around 11/15/2020) for Diabetes follow up with A1c in office, Thyroid follow up, Previsit labs.  Ronny Bacon, [facility] [address] [place], [place] [address] Phone: [phone] Fax: [phone]  07/15/2020, 2:36 PM

## 2020-07-15 NOTE — Patient Instructions (Signed)

## 2020-07-26 ENCOUNTER — Other Ambulatory Visit: Payer: Self-pay | Admitting: "Endocrinology

## 2020-09-25 ENCOUNTER — Other Ambulatory Visit: Payer: Self-pay | Admitting: "Endocrinology

## 2020-10-17 ENCOUNTER — Other Ambulatory Visit: Payer: Self-pay | Admitting: Nurse Practitioner

## 2020-11-13 ENCOUNTER — Other Ambulatory Visit: Payer: Self-pay | Admitting: "Endocrinology

## 2020-11-14 LAB — COMPLETE METABOLIC PANEL WITH GFR
AG Ratio: 1.8 (calc) (ref 1.0–2.5)
ALT: 22 U/L (ref 6–29)
AST: 29 U/L (ref 10–35)
Albumin: 4.2 g/dL (ref 3.6–5.1)
Alkaline phosphatase (APISO): 55 U/L (ref 37–153)
BUN: 12 mg/dL (ref 7–25)
CO2: 29 mmol/L (ref 20–32)
Calcium: 9.8 mg/dL (ref 8.6–10.4)
Chloride: 103 mmol/L (ref 98–110)
Creat: 0.76 mg/dL (ref 0.50–0.99)
GFR, Est African American: 98 mL/min/{1.73_m2} (ref >=60)
GFR, Est Non African American: 85 mL/min/{1.73_m2} (ref >=60)
Globulin: 2.4 g/dL (calc) (ref 1.9–3.7)
Glucose, Bld: 132 mg/dL — ABNORMAL HIGH (ref 65–99)
Potassium: 4.4 mmol/L (ref 3.5–5.3)
Sodium: 139 mmol/L (ref 135–146)
Total Bilirubin: 0.4 mg/dL (ref 0.2–1.2)
Total Protein: 6.6 g/dL (ref 6.1–8.1)

## 2020-11-14 LAB — TSH: TSH: 0.9 mIU/L (ref 0.40–4.50)

## 2020-11-14 LAB — T4, FREE: Free T4: 1.3 ng/dL (ref 0.8–1.8)

## 2020-11-19 ENCOUNTER — Other Ambulatory Visit: Payer: Self-pay

## 2020-11-19 ENCOUNTER — Encounter: Payer: Self-pay | Admitting: Nurse Practitioner

## 2020-11-19 ENCOUNTER — Ambulatory Visit (INDEPENDENT_AMBULATORY_CARE_PROVIDER_SITE_OTHER): Payer: BC Managed Care – PPO | Admitting: Nurse Practitioner

## 2020-11-19 VITALS — BP 132/79 | HR 78 | Ht 67.0 in | Wt 221.0 lb

## 2020-11-19 DIAGNOSIS — E782 Mixed hyperlipidemia: Secondary | ICD-10-CM

## 2020-11-19 DIAGNOSIS — E559 Vitamin D deficiency, unspecified: Secondary | ICD-10-CM

## 2020-11-19 DIAGNOSIS — I10 Essential (primary) hypertension: Secondary | ICD-10-CM | POA: Diagnosis not present

## 2020-11-19 DIAGNOSIS — E039 Hypothyroidism, unspecified: Secondary | ICD-10-CM | POA: Diagnosis not present

## 2020-11-19 DIAGNOSIS — E119 Type 2 diabetes mellitus without complications: Secondary | ICD-10-CM

## 2020-11-19 LAB — POCT GLYCOSYLATED HEMOGLOBIN (HGB A1C): HbA1c, POC (controlled diabetic range): 7 % (ref 0.0–7.0)

## 2020-11-19 NOTE — Progress Notes (Signed)
11/19/2020  Endocrinology follow-up note  Subjective:    Patient ID: Denise Oneill, female    DOB: April 01, 1961, PCP Garald Braver   Past Medical History:  Diagnosis Date  . Asthma   . Diabetes mellitus without complication (HCC)   . Goiter   . Hypertension   . Hypothyroidism    Past Surgical History:  Procedure Laterality Date  . COLONOSCOPY N/A 02/07/2018   Procedure: COLONOSCOPY;  Surgeon: Malissa Hippo, MD;  Location: [facility];  Service: Endoscopy;  Laterality: N/A;  830  . Partial Thyroidiectomy     Social History   Socioeconomic History  . Marital status: Married    Spouse name: Not on file  . Number of children: Not on file  . Years of education: Not on file  . Highest education level: Not on file  Occupational History  . Not on file  Tobacco Use  . Smoking status: Never Smoker  . Smokeless tobacco: Never Used  Vaping Use  . Vaping Use: Never used  Substance and Sexual Activity  . Alcohol use: No  . Drug use: No  . Sexual activity: Not on file  Other Topics Concern  . Not on file  Social History Narrative  . Not on file   Social Determinants of Health   Financial Resource Strain: Not on file  Food Insecurity: Not on file  Transportation Needs: Not on file  Physical Activity: Not on file  Stress: Not on file  Social Connections: Not on file   Outpatient Encounter Medications as of 11/19/2020  Medication Sig  . albuterol (PROVENTIL HFA;VENTOLIN HFA) 108 (90 Base) MCG/ACT inhaler Inhale 2 puffs into the lungs every 6 (six) hours as needed for wheezing or shortness of breath.  . anastrozole (ARIMIDEX) 1 MG tablet Take 1 mg by mouth daily.  Marland Kitchen levothyroxine (SYNTHROID) 75 MCG tablet TAKE 1 TABLET BY MOUTH EVERY DAY BEFORE BREAKFAST  . lisinopril-hydrochlorothiazide (ZESTORETIC) 10-12.5 MG tablet TAKE 1 TABLET BY MOUTH EVERY DAY  . metFORMIN (GLUCOPHAGE) 500 MG tablet TAKE 1 TABLET BY MOUTH EVERY DAY WITH BREAKFAST  . rosuvastatin  (CRESTOR) 10 MG tablet TAKE 1 TABLET BY MOUTH EVERY DAY  . Vitamin D, Ergocalciferol, (DRISDOL) 1.25 MG (50000 UNIT) CAPS capsule TAKE 1 CAPSULE (50,000 UNITS TOTAL) BY MOUTH EVERY 7 (SEVEN) DAYS. (Patient not taking: Reported on 11/19/2020)   No facility-administered encounter medications on file as of 11/19/2020.   ALLERGIES: Allergies  Allergen Reactions  . Penicillins Hives    Has patient had a PCN reaction causing immediate rash, facial/tongue/throat swelling, SOB or lightheadedness with hypotension: Yes Has patient had a PCN reaction causing severe rash involving mucus membranes or skin necrosis: No Has patient had a PCN reaction that required hospitalization: Yes Has patient had a PCN reaction occurring within the last 10 years: No If all of the above answers are "NO", then may proceed with Cephalosporin use.  . Sugar-Protein-Starch     Artificial sweeteners: give her a headache    VACCINATION STATUS:  There is no immunization history on file for this patient.  Diabetes She presents for her follow-up diabetic visit. She has type 2 diabetes mellitus. Her disease course has been stable. Pertinent negatives for hypoglycemia include no nervousness/anxiousness or tremors. Pertinent negatives for diabetes include no blurred vision, no chest pain, no fatigue, no polydipsia, no polyphagia, no polyuria and no weight loss. There are no hypoglycemic complications. Symptoms are stable. Risk factors for coronary artery disease include diabetes mellitus, dyslipidemia,  obesity, hypertension and sedentary lifestyle. Current diabetic treatment includes oral agent (monotherapy). She is compliant with treatment most of the time. Her weight is fluctuating minimally. She is following a generally unhealthy diet. When asked about meal planning, she reported none. She has not had a previous visit with a dietitian. She participates in exercise intermittently. (She presents today with no meter or logs to review.   She does not routinely monitor glucose due to monotherapy of Metformin only.  Her POCT A1c today is 7%, increasing from previous visit of 6.3%.  She admits she has not been as active during these colder months.) An ACE inhibitor/angiotensin II receptor blocker is being taken. She does not see a podiatrist.Eye exam is current.  Thyroid Problem Presents for follow-up (- Her history includes partial left hemithyroidectomy in 2000 for nodular goiter. ) visit. Patient reports no anxiety, cold intolerance, constipation, depressed mood, diarrhea, fatigue, heat intolerance, palpitations, tremors or weight loss. The symptoms have been stable. Her past medical history is significant for hyperlipidemia.  Hyperlipidemia This is a chronic problem. The current episode started more than 1 year ago. The problem is controlled. Recent lipid tests were reviewed and are normal. Exacerbating diseases include hypothyroidism and obesity. Factors aggravating her hyperlipidemia include thiazides. Pertinent negatives include no chest pain. Current antihyperlipidemic treatment includes statins. The current treatment provides moderate improvement of lipids. There are no compliance problems.  Risk factors for coronary artery disease include diabetes mellitus, dyslipidemia, hypertension, obesity and a sedentary lifestyle.  Hypertension This is a chronic problem. The problem has been gradually improving since onset. The problem is controlled. Pertinent negatives include no blurred vision, chest pain or palpitations. Agents associated with hypertension include thyroid hormones. Risk factors for coronary artery disease include diabetes mellitus, dyslipidemia, obesity and sedentary lifestyle. Past treatments include ACE inhibitors and diuretics. The current treatment provides moderate improvement. There are no compliance problems.  Identifiable causes of hypertension include a thyroid problem.    Review of systems  Constitutional: +  Minimally fluctuating body weight,  current Body mass index is 34.61 kg/m. , no fatigue, no subjective hyperthermia, no subjective hypothermia Eyes: no blurry vision, no xerophthalmia ENT: no sore throat, no nodules palpated in throat, no dysphagia/odynophagia, no hoarseness Cardiovascular: no chest pain, no shortness of breath, no palpitations, no leg swelling Respiratory: no cough, no shortness of breath Gastrointestinal: no nausea/vomiting/diarrhea Musculoskeletal: no muscle/joint aches Skin: no rashes, no hyperemia Neurological: no tremors, no numbness, no tingling, no dizziness Psychiatric: no depression, no anxiety  Objective:    BP 132/79   Pulse 78   Ht  (1.702 m)   Wt 221 lb (100.2 kg)   BMI 34.61 kg/m   Wt Readings from Last 3 Encounters:  11/19/20 221 lb (100.2 kg)  07/15/20 225 lb (102.1 kg)  01/09/20 222 lb (100.7 kg)    BP Readings from Last 3 Encounters:  11/19/20 132/79  07/15/20 123/79  01/09/20 125/82    Physical Exam- Limited  Constitutional:  Body mass index is 34.61 kg/m. , not in acute distress, normal state of mind Eyes:  EOMI, no exophthalmos Neck: Supple Thyroid: No gross goiter, + Scar from partial thyroidectomy in the past Cardiovascular: RRR, no murmers, rubs, or gallops, no edema Respiratory: Adequate breathing efforts, no crackles, rales, rhonchi, or wheezing Musculoskeletal: no gross deformities, strength intact in all four extremities, no gross restriction of joint movements Skin:  no rashes, no hyperemia Neurological: no tremor with outstretched hands   Foot exam:   No  rashes, ulcers, cuts, calluses, onychodystrophy.   Good pulses bilat.  Good sensation to 10 g monofilament bilat.  Missing toenails to bilateral 2nd-5th toes    POCT ABI Results 11/19/20   Right ABI:  1.06      Left ABI:  1.02  Right leg systolic / diastolic: 140/75 mmHg Left leg systolic / diastolic: 134/73 mmHg  Arm systolic / diastolic: 132/79  mmHG  Detailed report will be scanned into patient chart.   Recent Results (from the past 2160 hour(s))  COMPLETE METABOLIC PANEL WITH GFR     Status: Abnormal   Collection Time: 11/13/20  7:30 AM  Result Value Ref Range   Glucose, Bld 132 (H) 65 - 99 mg/dL    Comment: .            Fasting reference interval . For someone without known diabetes, a glucose value >125 mg/dL indicates that they may have diabetes and this should be confirmed with a follow-up test. .    BUN 12 7 - 25 mg/dL   Creat 2.83 6.62 - 9.47 mg/dL    Comment: For patients >17 years of age, the reference limit for Creatinine is approximately 13% higher for people identified as African-American. .    GFR, Est Non African American 85 > OR = 60 mL/min/1.39m2   GFR, Est African American 98 > OR = 60 mL/min/1.84m2   BUN/Creatinine Ratio NOT APPLICABLE 6 - 22 (calc)   Sodium 139 135 - 146 mmol/L   Potassium 4.4 3.5 - 5.3 mmol/L   Chloride 103 98 - 110 mmol/L   CO2 29 20 - 32 mmol/L   Calcium 9.8 8.6 - 10.4 mg/dL   Total Protein 6.6 6.1 - 8.1 g/dL   Albumin 4.2 3.6 - 5.1 g/dL   Globulin 2.4 1.9 - 3.7 g/dL (calc)   AG Ratio 1.8 1.0 - 2.5 (calc)   Total Bilirubin 0.4 0.2 - 1.2 mg/dL   Alkaline phosphatase (APISO) 55 37 - 153 U/L   AST 29 10 - 35 U/L   ALT 22 6 - 29 U/L  TSH     Status: None   Collection Time: 11/13/20  7:30 AM  Result Value Ref Range   TSH 0.90 0.40 - 4.50 mIU/L  T4, free     Status: None   Collection Time: 11/13/20  7:30 AM  Result Value Ref Range   Free T4 1.3 0.8 - 1.8 ng/dL  HgB M5Y     Status: None   Collection Time: 11/19/20  3:51 PM  Result Value Ref Range   Hemoglobin A1C     HbA1c POC (<> result, manual entry)     HbA1c, POC (prediabetic range)     HbA1c, POC (controlled diabetic range) 7.0 0.0 - 7.0 %      Assessment & Plan:   1) Hypothyroidism- postsurgical -Her previsit thyroid function tests are consistent with appropriate hormone replacement.  She is advised to  continue Levothyroxine 75 mcg po daily before breakfast.    - We discussed about the correct intake of her thyroid hormone, on empty stomach at fasting, with water, separated by at least 30 minutes from breakfast and other medications,  and separated by more than 4 hours from calcium, iron, multivitamins, acid reflux medications (PPIs). -Patient is made aware of the fact that thyroid hormone replacement is needed for life, dose to be adjusted by periodic monitoring of thyroid function tests.  2) Controlled type 2 diabetes mellitus  - This patient has significant  metabolic syndrome including type 2 diabetes, dyslipidemia, hypertension, obesity, relative sedentary life.  She presents today with no meter or logs to review.  She does not routinely monitor glucose due to monotherapy of Metformin only.  Her POCT A1c today is 7%, increasing from previous visit of 6.3%.  She admits she has not been as active during these colder months.  - Nutritional counseling repeated at each appointment due to patients tendency to fall back in to old habits.  - The patient admits there is a room for improvement in their diet and drink choices. -  Suggestion is made for the patient to avoid simple carbohydrates from their diet including Cakes, Sweet Desserts / Pastries, Ice Cream, Soda (diet and regular), Sweet Tea, Candies, Chips, Cookies, Sweet Pastries,  Store Bought Juices, Alcohol in Excess of  1-2 drinks a day, Artificial Sweeteners, Coffee Creamer, and "Sugar-free" Products. This will help patient to have stable blood glucose profile and potentially avoid unintended weight gain.   - I encouraged the patient to switch to  unprocessed or minimally processed complex starch and increased protein intake (animal or plant source), fruits, and vegetables.   - Patient is advised to stick to a routine mealtimes to eat 3 meals  a day and avoid unnecessary snacks ( to snack only to correct hypoglycemia).  -She is advised to  continue Metformin 500 mg po daily with breakfast and work on diet and exercise.  3) Nodular goiter -Her thyroid ultrasound from 03/23/17 shows surgically absent left thyroid lobe and isthmus and mildly enlarged right thyroid lobe without discrete nodules. -No dedicated follow up needed at this time.  4) Hypertension:  Her blood pressure is controlled to target.  She is advised to continue Lisinopril-HCT 10-12.5 mg po daily with breakfast.  5) Hyperlipidemia:  Her most recent lipid panel from 07/08/20 shows controlled LDL of 67.  She is advised to continue Crestor 10 mg po daily at bedtime.  Side effects and precautions discussed with her.  6) Vitamin D deficiency: Her most recent vitamin D level was 47 on 07/08/20.  She has completed replenishment with Ergocalciferol.  She is advised to start daily maintenance dose of Vitamin D3 5000 units PO daily OTC.  Will recheck vitamin D level prior to next visit in 6 months.  - I advised patient to maintain close follow up with Richarda Blade E for primary care needs.  - Time spent on this patient care encounter:  40 min, of which > 50% was spent in  counseling and the rest reviewing her blood glucose logs , discussing her hypoglycemia and hyperglycemia episodes, reviewing her current and  previous labs / studies  ( including abstraction from other facilities) and medications  doses and developing a  long term treatment plan and documenting her care.   Please refer to Patient Instructions for Blood Glucose Monitoring and Insulin/Medications Dosing Guide"  in media tab for additional information. Please  also refer to " Patient Self Inventory" in the Media  tab for reviewed elements of pertinent patient history.  Denise Oneill participated in the discussions, expressed understanding, and voiced agreement with the above plans.  All questions were answered to her satisfaction. she is encouraged to contact clinic should she have any questions or  concerns prior to her return visit.    Follow up plan: Return in about 6 months (around 05/22/2021) for Diabetes follow up with A1c in office, Bring glucometer and logs, Previsit labs, Thyroid follow up.  Ronny Bacon, FNP-BC  [facility] [address] [place], [place] [address] Phone: [phone] Fax: [phone]  11/19/2020, 4:06 PM

## 2020-11-19 NOTE — Patient Instructions (Signed)

## 2020-12-03 ENCOUNTER — Other Ambulatory Visit: Payer: Self-pay | Admitting: "Endocrinology

## 2020-12-06 IMAGING — MG MM BREAST LOCALIZATION CLIP
4 series · 4 of 12 positions shown · non-contrast
Comparison: Previous exam(s).

CLINICAL DATA: Post biopsy mammogram of the right breast clip
placement.

EXAM:
DIAGNOSTIC RIGHT MAMMOGRAM POST ULTRASOUND BIOPSY

[R CC synth-2D]
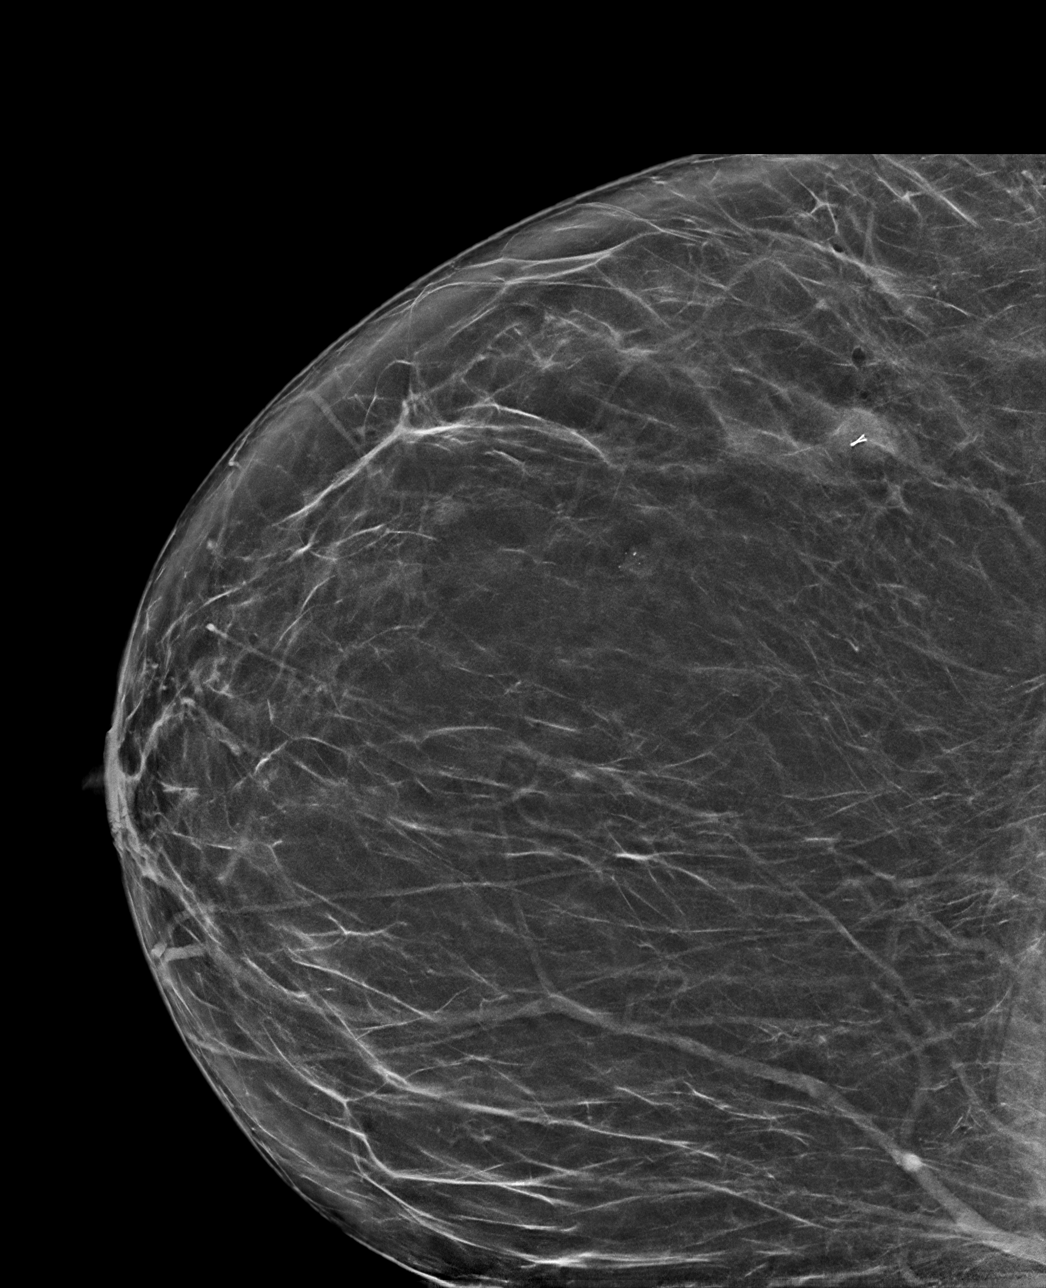

[R ML synth-2D]
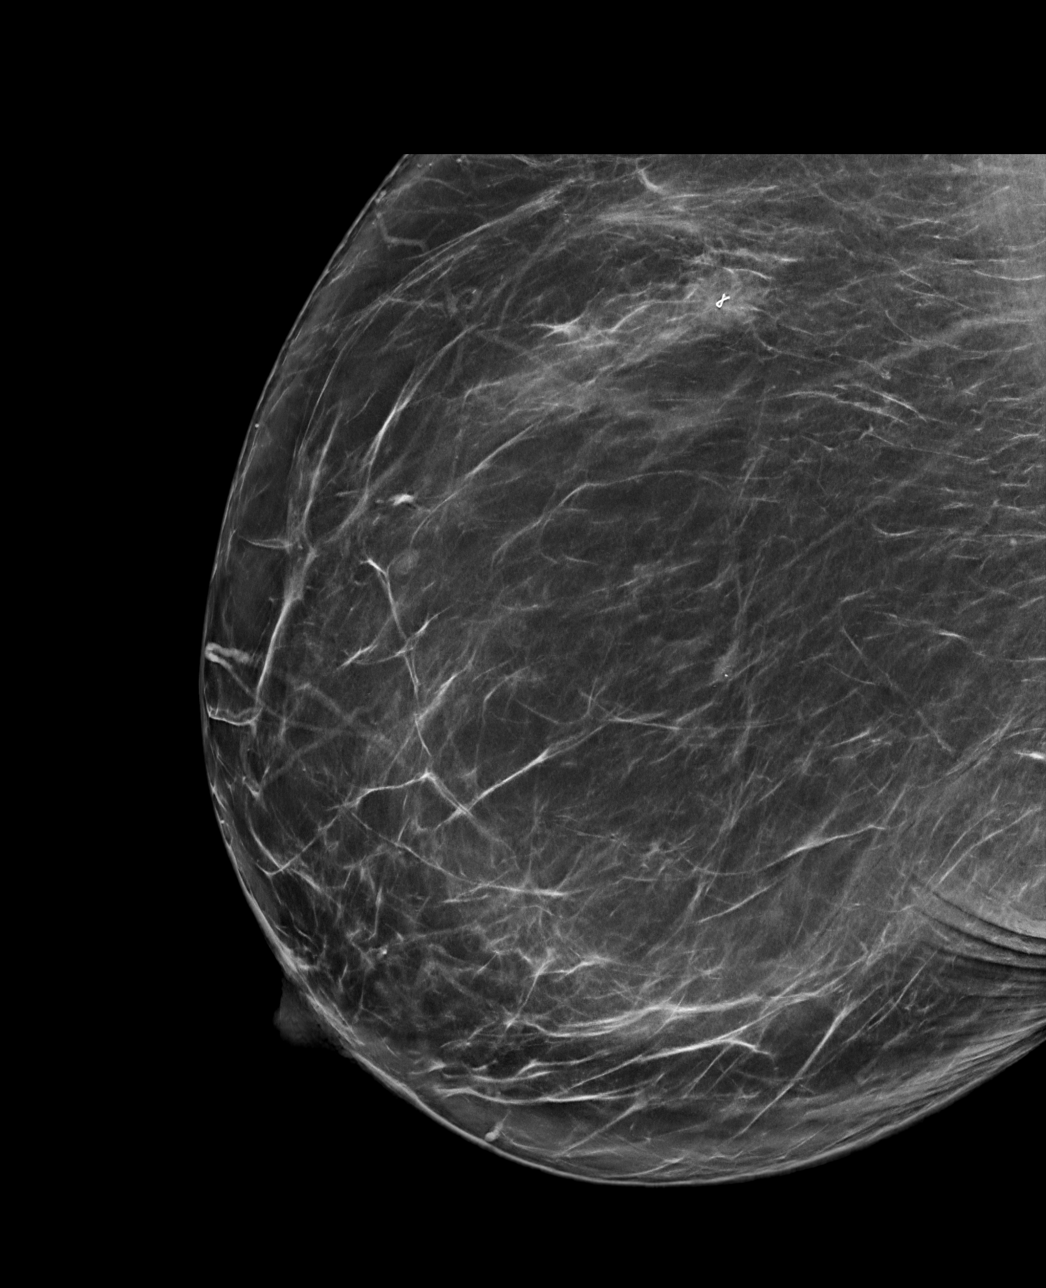

[R ML tomo · tomo slice 47/92.0]
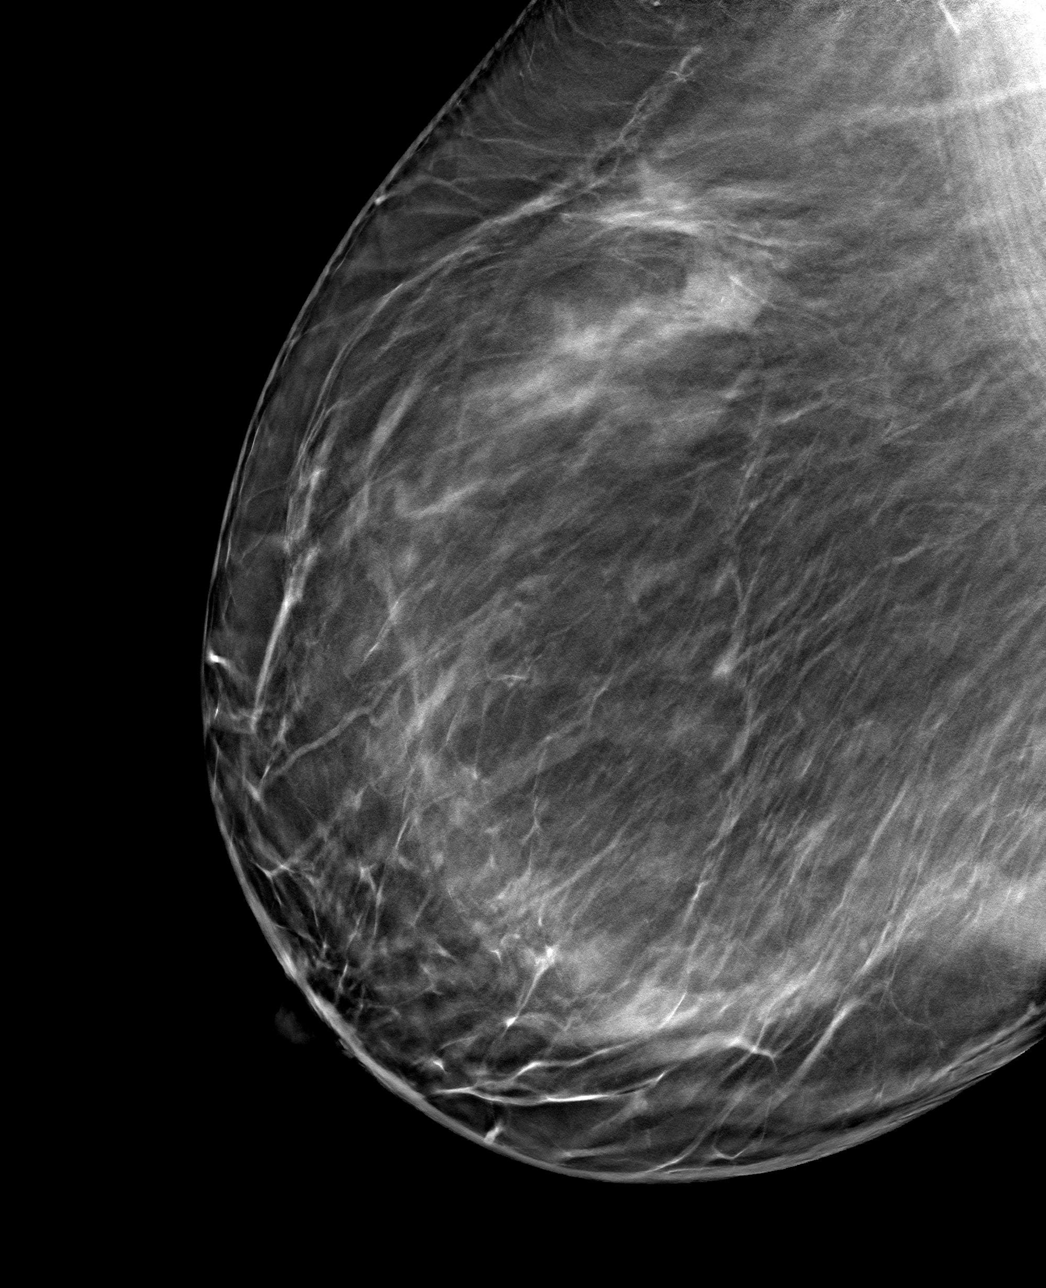

[R CC tomo · tomo slice 44/87.0]
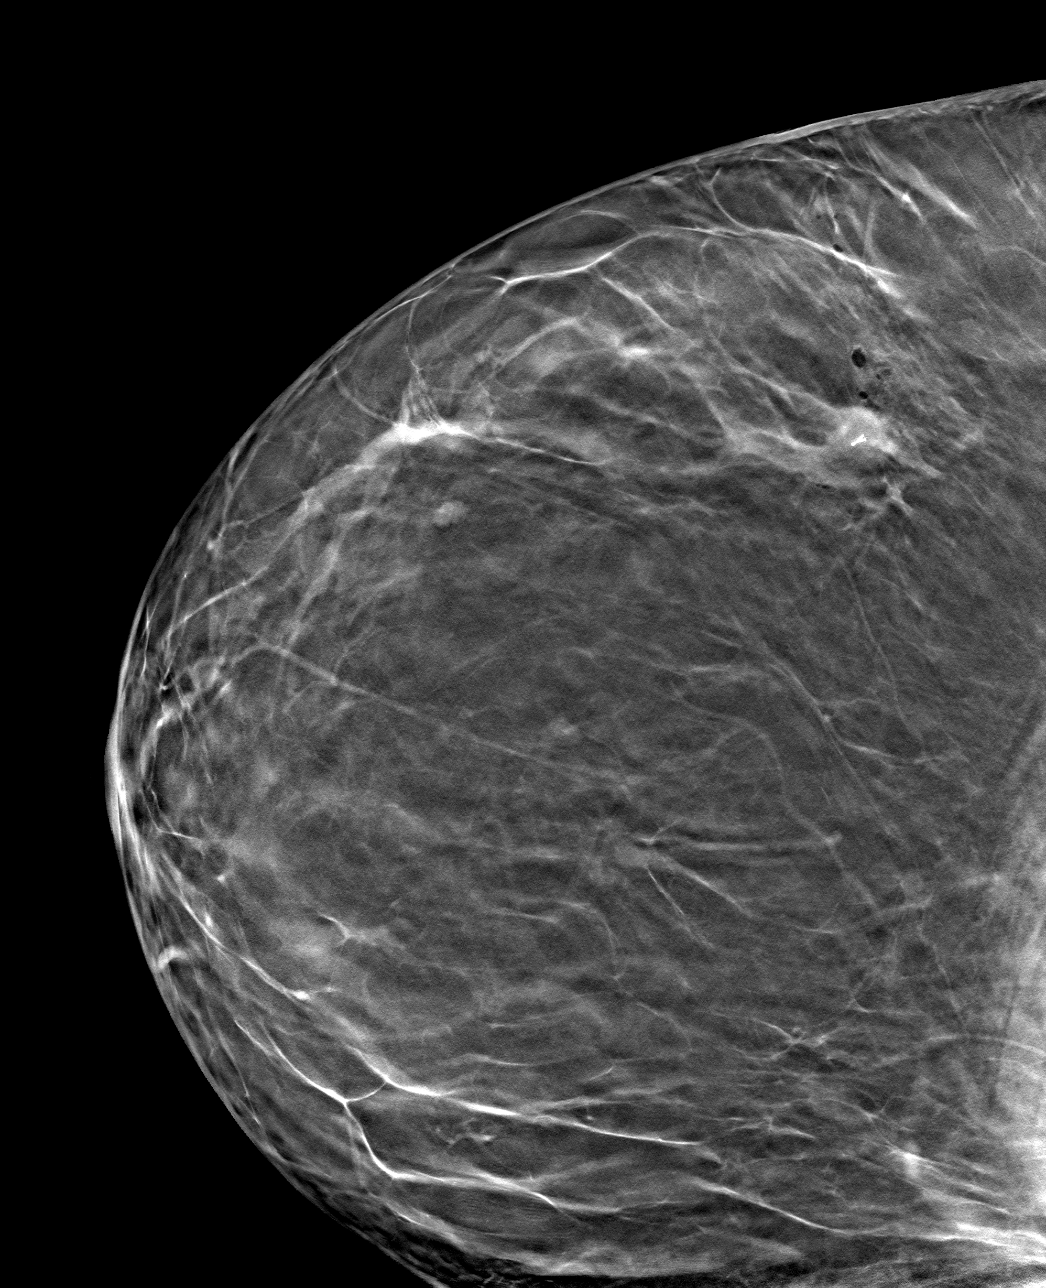

[4 of 12 positions shown; findings below may reference images not displayed]

FINDINGS: Mammographic images were obtained following ultrasound guided biopsy
of a right breast mass at 10 o'clock. The biopsy marking clip is in
expected position at the site of biopsy.
IMPRESSION: Appropriate positioning of the ribbon shaped shaped biopsy marking
clip at the site of biopsy in the upper-outer right breast.

Final Assessment: Post Procedure Mammograms for Marker Placement

## 2020-12-22 ENCOUNTER — Other Ambulatory Visit: Payer: Self-pay | Admitting: Nurse Practitioner

## 2021-01-15 ENCOUNTER — Other Ambulatory Visit: Payer: Self-pay | Admitting: "Endocrinology

## 2021-03-22 ENCOUNTER — Other Ambulatory Visit: Payer: Self-pay | Admitting: Nurse Practitioner

## 2021-04-01 ENCOUNTER — Other Ambulatory Visit: Payer: Self-pay | Admitting: Nurse Practitioner

## 2021-05-17 ENCOUNTER — Other Ambulatory Visit: Payer: Self-pay | Admitting: Nurse Practitioner

## 2021-05-18 LAB — COMPLETE METABOLIC PANEL WITH GFR
AG Ratio: 1.7 (calc) (ref 1.0–2.5)
ALT: 18 U/L (ref 6–29)
AST: 24 U/L (ref 10–35)
Albumin: 4.2 g/dL (ref 3.6–5.1)
Alkaline phosphatase (APISO): 70 U/L (ref 37–153)
BUN: 10 mg/dL (ref 7–25)
CO2: 30 mmol/L (ref 20–32)
Calcium: 10 mg/dL (ref 8.6–10.4)
Chloride: 103 mmol/L (ref 98–110)
Creat: 0.77 mg/dL (ref 0.50–1.05)
Globulin: 2.5 g/dL (calc) (ref 1.9–3.7)
Glucose, Bld: 151 mg/dL — ABNORMAL HIGH (ref 65–99)
Potassium: 4.5 mmol/L (ref 3.5–5.3)
Sodium: 140 mmol/L (ref 135–146)
Total Bilirubin: 0.6 mg/dL (ref 0.2–1.2)
Total Protein: 6.7 g/dL (ref 6.1–8.1)
eGFR: 88 mL/min/{1.73_m2} (ref >=60)

## 2021-05-18 LAB — T4, FREE: Free T4: 1.1 ng/dL (ref 0.8–1.8)

## 2021-05-18 LAB — TSH: TSH: 1.53 mIU/L (ref 0.40–4.50)

## 2021-05-18 LAB — VITAMIN D 25 HYDROXY (VIT D DEFICIENCY, FRACTURES): Vit D, 25-Hydroxy: 23 ng/mL — ABNORMAL LOW (ref 30–100)

## 2021-05-25 ENCOUNTER — Ambulatory Visit: Payer: BC Managed Care – PPO | Admitting: Nurse Practitioner

## 2021-05-25 ENCOUNTER — Other Ambulatory Visit: Payer: Self-pay

## 2021-05-25 ENCOUNTER — Encounter: Payer: Self-pay | Admitting: Nurse Practitioner

## 2021-05-25 VITALS — BP 100/69 | HR 84 | Ht 67.0 in | Wt 218.4 lb

## 2021-05-25 DIAGNOSIS — E782 Mixed hyperlipidemia: Secondary | ICD-10-CM | POA: Diagnosis not present

## 2021-05-25 DIAGNOSIS — E559 Vitamin D deficiency, unspecified: Secondary | ICD-10-CM

## 2021-05-25 DIAGNOSIS — I10 Essential (primary) hypertension: Secondary | ICD-10-CM

## 2021-05-25 DIAGNOSIS — E119 Type 2 diabetes mellitus without complications: Secondary | ICD-10-CM | POA: Diagnosis not present

## 2021-05-25 DIAGNOSIS — E039 Hypothyroidism, unspecified: Secondary | ICD-10-CM | POA: Diagnosis not present

## 2021-05-25 LAB — POCT UA - MICROALBUMIN
Albumin/Creatinine Ratio, Urine, POC: <30
Creatinine, POC: 300 mg/dL
Microalbumin Ur, POC: 30 mg/L

## 2021-05-25 LAB — POCT GLYCOSYLATED HEMOGLOBIN (HGB A1C): Hemoglobin A1C: 6.8 % — AB (ref 4.0–5.6)

## 2021-05-25 MED ORDER — VITAMIN D (ERGOCALCIFEROL) 1.25 MG (50000 UNIT) PO CAPS: 50000.0000 [IU] | ORAL_CAPSULE | ORAL | 0 refills | Status: DC

## 2021-05-25 NOTE — Patient Instructions (Signed)

## 2021-05-25 NOTE — Progress Notes (Signed)
05/25/2021  Endocrinology follow-up note  Subjective:    Patient ID: Denise Oneill, female    DOB: 1961/02/01, PCP Richarda Blade E   Past Medical History:  Diagnosis Date   Asthma    Diabetes mellitus without complication (HCC)    Goiter    Hypertension    Hypothyroidism    Past Surgical History:  Procedure Laterality Date   COLONOSCOPY N/A 02/07/2018   Procedure: COLONOSCOPY;  Surgeon: Malissa Hippo, MD;  Location: [facility];  Service: Endoscopy;  Laterality: N/A;  830   Partial Thyroidiectomy     Social History   Socioeconomic History   Marital status: Married    Spouse name: Not on file   Number of children: Not on file   Years of education: Not on file   Highest education level: Not on file  Occupational History   Not on file  Tobacco Use   Smoking status: Never   Smokeless tobacco: Never  Vaping Use   Vaping Use: Never used  Substance and Sexual Activity   Alcohol use: No   Drug use: No   Sexual activity: Not on file  Other Topics Concern   Not on file  Social History Narrative   Not on file   Social Determinants of Health   Financial Resource Strain: Not on file  Food Insecurity: Not on file  Transportation Needs: Not on file  Physical Activity: Not on file  Stress: Not on file  Social Connections: Not on file   Outpatient Encounter Medications as of 05/25/2021  Medication Sig   albuterol (PROVENTIL HFA;VENTOLIN HFA) 108 (90 Base) MCG/ACT inhaler Inhale 2 puffs into the lungs every 6 (six) hours as needed for wheezing or shortness of breath.   anastrozole (ARIMIDEX) 1 MG tablet Take 1 mg by mouth daily.   levothyroxine (SYNTHROID) 75 MCG tablet TAKE 1 TABLET BY MOUTH EVERY DAY BEFORE BREAKFAST   lisinopril-hydrochlorothiazide (ZESTORETIC) 10-12.5 MG tablet TAKE 1 TABLET BY MOUTH EVERY DAY   meloxicam (MOBIC) 15 MG tablet Take 15 mg by mouth daily.   metFORMIN (GLUCOPHAGE) 500 MG tablet TAKE 1 TABLET BY MOUTH EVERY DAY WITH BREAKFAST    rosuvastatin (CRESTOR) 10 MG tablet TAKE 1 TABLET BY MOUTH EVERY DAY   Vitamin D, Ergocalciferol, (DRISDOL) 1.25 MG (50000 UNIT) CAPS capsule TAKE 1 CAPSULE (50,000 UNITS TOTAL) BY MOUTH EVERY 7 (SEVEN) DAYS. (Patient not taking: No sig reported)   No facility-administered encounter medications on file as of 05/25/2021.   ALLERGIES: Allergies  Allergen Reactions   Penicillins Hives    Has patient had a PCN reaction causing immediate rash, facial/tongue/throat swelling, SOB or lightheadedness with hypotension: Yes Has patient had a PCN reaction causing severe rash involving mucus membranes or skin necrosis: No Has patient had a PCN reaction that required hospitalization: Yes Has patient had a PCN reaction occurring within the last 10 years: No If all of the above answers are "NO", then may proceed with Cephalosporin use.   Sugar-Protein-Starch     Artificial sweeteners: give her a headache    VACCINATION STATUS:  There is no immunization history on file for this patient.  Diabetes She presents for her follow-up diabetic visit. She has type 2 diabetes mellitus. Her disease course has been improving. Pertinent negatives for hypoglycemia include no nervousness/anxiousness or tremors. Pertinent negatives for diabetes include no blurred vision, no chest pain, no fatigue, no polydipsia, no polyphagia, no polyuria and no weight loss. There are no hypoglycemic complications. Symptoms are  stable. Diabetic complications include nephropathy. Risk factors for coronary artery disease include diabetes mellitus, dyslipidemia, obesity, hypertension and sedentary lifestyle. Current diabetic treatment includes oral agent (monotherapy). She is compliant with treatment most of the time. Her weight is decreasing steadily. She is following a generally healthy diet. When asked about meal planning, she reported none. She has not had a previous visit with a dietitian. She participates in exercise intermittently. (She  presents today with no meter or logs to review.  She does not routinely monitor glucose due to monotherapy of Metformin only.  Her POCT A1c today is 6.8%, improving from last visit of 7%.  She denies any s/s of hypoglycemia.) An ACE inhibitor/angiotensin II receptor blocker is being taken. She does not see a podiatrist.Eye exam is current.  Thyroid Problem Presents for follow-up (- Her history includes partial left hemithyroidectomy in 2000 for nodular goiter.) visit. Patient reports no anxiety, cold intolerance, constipation, depressed mood, diarrhea, fatigue, heat intolerance, palpitations, tremors or weight loss. The symptoms have been stable. Her past medical history is significant for hyperlipidemia.  Hyperlipidemia This is a chronic problem. The current episode started more than 1 year ago. The problem is controlled. Recent lipid tests were reviewed and are normal. Exacerbating diseases include chronic renal disease, hypothyroidism and obesity. Factors aggravating her hyperlipidemia include thiazides. Pertinent negatives include no chest pain. Current antihyperlipidemic treatment includes statins. The current treatment provides moderate improvement of lipids. There are no compliance problems.  Risk factors for coronary artery disease include diabetes mellitus, dyslipidemia, hypertension, obesity and a sedentary lifestyle.  Hypertension This is a chronic problem. The problem has been gradually improving since onset. The problem is controlled. Pertinent negatives include no blurred vision, chest pain or palpitations. Agents associated with hypertension include thyroid hormones. Risk factors for coronary artery disease include diabetes mellitus, dyslipidemia, obesity and sedentary lifestyle. Past treatments include ACE inhibitors and diuretics. The current treatment provides moderate improvement. There are no compliance problems.  Hypertensive end-organ damage includes kidney disease. Identifiable causes of  hypertension include chronic renal disease and a thyroid problem.   Review of systems  Constitutional: + Minimally fluctuating body weight,  current Body mass index is 34.21 kg/m. , no fatigue, no subjective hyperthermia, no subjective hypothermia Eyes: no blurry vision, no xerophthalmia ENT: no sore throat, no nodules palpated in throat, no dysphagia/odynophagia, no hoarseness Cardiovascular: no chest pain, no shortness of breath, no palpitations, no leg swelling Respiratory: no cough, no shortness of breath Gastrointestinal: no nausea/vomiting/diarrhea Musculoskeletal: no muscle/joint aches Skin: no rashes, no hyperemia Neurological: no tremors, no numbness, no tingling, no dizziness Psychiatric: no depression, no anxiety  Objective:    BP 100/69   Pulse 84   Ht $R'5\' 7"'pa$  (1.702 m)   Wt 218 lb 6.4 oz (99.1 kg)   BMI 34.21 kg/m   Wt Readings from Last 3 Encounters:  05/25/21 218 lb 6.4 oz (99.1 kg)  11/19/20 221 lb (100.2 kg)  07/15/20 225 lb (102.1 kg)    BP Readings from Last 3 Encounters:  05/25/21 100/69  11/19/20 132/79  07/15/20 123/79    Physical Exam- Limited  Constitutional:  Body mass index is 34.21 kg/m. , not in acute distress, normal state of mind Eyes:  EOMI, no exophthalmos Neck: Supple Thyroid: No gross goiter, + Scar from partial thyroidectomy in the past Cardiovascular: RRR, no murmurs, rubs, or gallops, no edema Respiratory: Adequate breathing efforts, no crackles, rales, rhonchi, or wheezing Musculoskeletal: no gross deformities, strength intact in all four extremities, no  gross restriction of joint movements Skin:  no rashes, no hyperemia Neurological: no tremor with outstretched hands    Recent Results (from the past 2160 hour(s))  COMPLETE METABOLIC PANEL WITH GFR     Status: Abnormal   Collection Time: 05/17/21  7:41 AM  Result Value Ref Range   Glucose, Bld 151 (H) 65 - 99 mg/dL    Comment: .            Fasting reference interval . For  someone without known diabetes, a glucose value >125 mg/dL indicates that they may have diabetes and this should be confirmed with a follow-up test. .    BUN 10 7 - 25 mg/dL   Creat 0.77 0.50 - 1.05 mg/dL   eGFR 88 > OR = 60 mL/min/1.86m2    Comment: The eGFR is based on the CKD-EPI 2021 equation. To calculate  the new eGFR from a previous Creatinine or Cystatin C result, go to https://www.kidney.org/professionals/ kdoqi/gfr%5Fcalculator    BUN/Creatinine Ratio NOT APPLICABLE 6 - 22 (calc)   Sodium 140 135 - 146 mmol/L   Potassium 4.5 3.5 - 5.3 mmol/L   Chloride 103 98 - 110 mmol/L   CO2 30 20 - 32 mmol/L   Calcium 10.0 8.6 - 10.4 mg/dL   Total Protein 6.7 6.1 - 8.1 g/dL   Albumin 4.2 3.6 - 5.1 g/dL   Globulin 2.5 1.9 - 3.7 g/dL (calc)   AG Ratio 1.7 1.0 - 2.5 (calc)   Total Bilirubin 0.6 0.2 - 1.2 mg/dL   Alkaline phosphatase (APISO) 70 37 - 153 U/L   AST 24 10 - 35 U/L   ALT 18 6 - 29 U/L  VITAMIN D 25 Hydroxy (Vit-D Deficiency, Fractures)     Status: Abnormal   Collection Time: 05/17/21  7:41 AM  Result Value Ref Range   Vit D, 25-Hydroxy 23 (L) 30 - 100 ng/mL    Comment: Vitamin D Status         25-OH Vitamin D: . Deficiency:                    <20 ng/mL Insufficiency:             20 - 29 ng/mL Optimal:                 > or = 30 ng/mL . For 25-OH Vitamin D testing on patients on  D2-supplementation and patients for whom quantitation  of D2 and D3 fractions is required, the [facility](TM) 25-OH VIT D, (D2,D3), LC/MS/MS is recommended: order  code [id number] (patients >60yrs). See Note 1 . Note 1 . For additional information, please refer to  http://education.[facility].com/faq/FAQ199  (This link is being provided for informational/ educational purposes only.)   TSH     Status: None   Collection Time: 05/17/21  7:41 AM  Result Value Ref Range   TSH 1.53 0.40 - 4.50 mIU/L  T4, free     Status: None   Collection Time: 05/17/21  7:41 AM  Result Value Ref  Range   Free T4 1.1 0.8 - 1.8 ng/dL      Assessment & Plan:   1) Hypothyroidism- postsurgical -Her previsit thyroid function tests are consistent with appropriate hormone replacement.  She is advised to continue her current dose of Levothyroxine 75 mcg po daily before breakfast.    - We discussed about the correct intake of her thyroid hormone, on empty stomach at fasting, with water, separated by at least 30 minutes  from breakfast and other medications,  and separated by more than 4 hours from calcium, iron, multivitamins, acid reflux medications (PPIs). -Patient is made aware of the fact that thyroid hormone replacement is needed for life, dose to be adjusted by periodic monitoring of thyroid function tests.  2) Controlled type 2 diabetes mellitus  - This patient has significant metabolic syndrome including type 2 diabetes, dyslipidemia, hypertension, obesity, relative sedentary life.  She presents today with no meter or logs to review.  She does not routinely monitor glucose due to monotherapy of Metformin only.  Her POCT A1c today is 6.8%, improving from last visit of 7%.  She denies any s/s of hypoglycemia.  - Nutritional counseling repeated at each appointment due to patients tendency to fall back in to old habits.  - The patient admits there is a room for improvement in their diet and drink choices. -  Suggestion is made for the patient to avoid simple carbohydrates from their diet including Cakes, Sweet Desserts / Pastries, Ice Cream, Soda (diet and regular), Sweet Tea, Candies, Chips, Cookies, Sweet Pastries, Store Bought Juices, Alcohol in Excess of 1-2 drinks a day, Artificial Sweeteners, Coffee Creamer, and "Sugar-free" Products. This will help patient to have stable blood glucose profile and potentially avoid unintended weight gain.   - I encouraged the patient to switch to unprocessed or minimally processed complex starch and increased protein intake (animal or plant source),  fruits, and vegetables.   - Patient is advised to stick to a routine mealtimes to eat 3 meals a day and avoid unnecessary snacks (to snack only to correct hypoglycemia).  -She is advised to continue Metformin 500 mg po daily with breakfast and continue working on diet and exercise.  3) Nodular goiter -Her thyroid ultrasound from 03/23/17 shows surgically absent left thyroid lobe and isthmus and mildly enlarged right thyroid lobe without discrete nodules. -No dedicated follow up needed at this time.  4) Hypertension:  Her blood pressure is controlled to target.  She is advised to continue Lisinopril-HCT 10-12.5 mg po daily with breakfast.  5) Hyperlipidemia:  Her most recent lipid panel from 07/08/20 shows controlled LDL of 67.  She is advised to continue Crestor 10 mg po daily at bedtime.  Side effects and precautions discussed with her.  6) Vitamin D deficiency: Her most recent vitamin D level was 23 on 05/17/21.  She is taking daily maintenance dose of Vitamin D3 5000 units PO daily OTC.  I discussed and restarted Ergocalciferol 50000 units weekly for now.  - I advised patient to maintain close follow up with Ephriam Jenkins E for primary care needs.    I spent 31 minutes in the care of the patient today including review of labs from [place], Lipids, Thyroid Function, Hematology (current and previous including abstractions from other facilities); face-to-face time discussing  her blood glucose readings/logs, discussing hypoglycemia and hyperglycemia episodes and symptoms, medications doses, her options of short and long term treatment based on the latest standards of care / guidelines;  discussion about incorporating lifestyle medicine;  and documenting the encounter.    Please refer to Patient Instructions for Blood Glucose Monitoring and Insulin/Medications Dosing Guide"  in media tab for additional information. Please  also refer to " Patient Self Inventory" in the Media  tab for reviewed  elements of pertinent patient history.  Sharma Covert participated in the discussions, expressed understanding, and voiced agreement with the above plans.  All questions were answered to her satisfaction. she is encouraged  to contact clinic should she have any questions or concerns prior to her return visit.    Follow up plan: Return in about 6 months (around 11/22/2021) for Diabetes F/U with A1c in office, Thyroid follow up, Previsit labs.  Rayetta Pigg, [facility] [address] [place], [place] [address] Phone: [phone] Fax: [phone]  05/25/2021, 3:27 PM

## 2021-06-02 ENCOUNTER — Other Ambulatory Visit: Payer: Self-pay | Admitting: "Endocrinology

## 2021-06-19 ENCOUNTER — Other Ambulatory Visit: Payer: Self-pay | Admitting: "Endocrinology

## 2021-07-10 ENCOUNTER — Other Ambulatory Visit: Payer: Self-pay | Admitting: "Endocrinology

## 2021-09-19 ENCOUNTER — Other Ambulatory Visit: Payer: Self-pay | Admitting: Nurse Practitioner

## 2021-11-16 LAB — COMPREHENSIVE METABOLIC PANEL
AG Ratio: 1.5 (calc) (ref 1.0–2.5)
ALT: 13 U/L (ref 6–29)
AST: 21 U/L (ref 10–35)
Albumin: 4.1 g/dL (ref 3.6–5.1)
Alkaline phosphatase (APISO): 62 U/L (ref 37–153)
BUN: 12 mg/dL (ref 7–25)
CO2: 27 mmol/L (ref 20–32)
Calcium: 10 mg/dL (ref 8.6–10.4)
Chloride: 105 mmol/L (ref 98–110)
Creat: 0.78 mg/dL (ref 0.50–1.05)
Globulin: 2.7 g/dL (calc) (ref 1.9–3.7)
Glucose, Bld: 130 mg/dL — ABNORMAL HIGH (ref 65–99)
Potassium: 4.4 mmol/L (ref 3.5–5.3)
Sodium: 142 mmol/L (ref 135–146)
Total Bilirubin: 0.3 mg/dL (ref 0.2–1.2)
Total Protein: 6.8 g/dL (ref 6.1–8.1)

## 2021-11-16 LAB — T4, FREE: Free T4: 1.2 ng/dL (ref 0.8–1.8)

## 2021-11-16 LAB — TSH: TSH: 1.59 mIU/L (ref 0.40–4.50)

## 2021-11-16 LAB — LIPID PANEL
Cholesterol: 134 mg/dL (ref <200)
HDL: 50 mg/dL (ref >=50)
LDL Cholesterol (Calc): 68 mg/dL (calc)
Non-HDL Cholesterol (Calc): 84 mg/dL (calc) (ref <130)
Total CHOL/HDL Ratio: 2.7 (calc) (ref <5.0)
Triglycerides: 80 mg/dL (ref <150)

## 2021-11-16 LAB — VITAMIN D 25 HYDROXY (VIT D DEFICIENCY, FRACTURES): Vit D, 25-Hydroxy: 68 ng/mL (ref 30–100)

## 2021-11-22 ENCOUNTER — Encounter: Payer: Self-pay | Admitting: Nurse Practitioner

## 2021-11-22 ENCOUNTER — Ambulatory Visit (INDEPENDENT_AMBULATORY_CARE_PROVIDER_SITE_OTHER): Payer: BC Managed Care – PPO | Admitting: Nurse Practitioner

## 2021-11-22 VITALS — BP 117/74 | HR 74 | Ht 67.0 in | Wt 215.4 lb

## 2021-11-22 DIAGNOSIS — E119 Type 2 diabetes mellitus without complications: Secondary | ICD-10-CM

## 2021-11-22 LAB — POCT GLYCOSYLATED HEMOGLOBIN (HGB A1C): HbA1c, POC (controlled diabetic range): 6.3 % (ref 0.0–7.0)

## 2021-11-22 NOTE — Progress Notes (Signed)
11/22/2021  Endocrinology follow-up note  Subjective:    Patient ID: Denise Oneill, female    DOB: 09-22-1960, PCP Ephriam Jenkins E   Past Medical History:  Diagnosis Date   Asthma    Diabetes mellitus without complication ([place])    Goiter    Hypertension    Hypothyroidism    Past Surgical History:  Procedure Laterality Date   COLONOSCOPY N/A 02/07/2018   Procedure: COLONOSCOPY;  Surgeon: Rogene Houston, MD;  Location: [facility];  Service: Endoscopy;  Laterality: N/A;  830   Partial Thyroidiectomy     Social History   Socioeconomic History   Marital status: Married    Spouse name: Not on file   Number of children: Not on file   Years of education: Not on file   Highest education level: Not on file  Occupational History   Not on file  Tobacco Use   Smoking status: Never   Smokeless tobacco: Never  Vaping Use   Vaping Use: Never used  Substance and Sexual Activity   Alcohol use: No   Drug use: No   Sexual activity: Not on file  Other Topics Concern   Not on file  Social History Narrative   Not on file   Social Determinants of Health   Financial Resource Strain: Not on file  Food Insecurity: Not on file  Transportation Needs: Not on file  Physical Activity: Not on file  Stress: Not on file  Social Connections: Not on file   Outpatient Encounter Medications as of 11/22/2021  Medication Sig   albuterol (PROVENTIL HFA;VENTOLIN HFA) 108 (90 Base) MCG/ACT inhaler Inhale 2 puffs into the lungs every 6 (six) hours as needed for wheezing or shortness of breath.   anastrozole (ARIMIDEX) 1 MG tablet Take 1 mg by mouth daily.   levothyroxine (SYNTHROID) 75 MCG tablet TAKE 1 TABLET BY MOUTH EVERY DAY BEFORE BREAKFAST   lisinopril-hydrochlorothiazide (ZESTORETIC) 10-12.5 MG tablet TAKE 1 TABLET BY MOUTH EVERY DAY   meloxicam (MOBIC) 15 MG tablet Take 15 mg by mouth daily.   metFORMIN (GLUCOPHAGE) 500 MG tablet TAKE 1 TABLET BY MOUTH EVERY DAY WITH BREAKFAST    rosuvastatin (CRESTOR) 10 MG tablet TAKE 1 TABLET BY MOUTH EVERY DAY   Vitamin D, Ergocalciferol, (DRISDOL) 1.25 MG (50000 UNIT) CAPS capsule Take 1 capsule (50,000 Units total) by mouth every 7 (seven) days.   No facility-administered encounter medications on file as of 11/22/2021.   ALLERGIES: Allergies  Allergen Reactions   Penicillins Hives    Has patient had a PCN reaction causing immediate rash, facial/tongue/throat swelling, SOB or lightheadedness with hypotension: Yes Has patient had a PCN reaction causing severe rash involving mucus membranes or skin necrosis: No Has patient had a PCN reaction that required hospitalization: Yes Has patient had a PCN reaction occurring within the last 10 years: No If all of the above answers are "NO", then may proceed with Cephalosporin use.   Sugar-Protein-Starch     Artificial sweeteners: give her a headache    VACCINATION STATUS:  There is no immunization history on file for this patient.  Diabetes She presents for her follow-up diabetic visit. She has type 2 diabetes mellitus. Her disease course has been improving. Pertinent negatives for hypoglycemia include no nervousness/anxiousness or tremors. Pertinent negatives for diabetes include no blurred vision, no chest pain, no fatigue, no polydipsia, no polyphagia, no polyuria and no weight loss. There are no hypoglycemic complications. Symptoms are stable. Diabetic complications include nephropathy. Risk  factors for coronary artery disease include diabetes mellitus, dyslipidemia, obesity, hypertension and sedentary lifestyle. Current diabetic treatment includes oral agent (monotherapy). She is compliant with treatment most of the time. Her weight is decreasing steadily. She is following a generally healthy diet. When asked about meal planning, she reported none. She has not had a previous visit with a dietitian. She participates in exercise three times a week. (She presents today with no meter or  logs to review.  She does not routinely monitor glucose due to monotherapy of Metformin only.  Her POCT A1c today is 6.3%, improving from last visit of 6.8%.  She denies any s/s of hypoglycemia.) An ACE inhibitor/angiotensin II receptor blocker is being taken. She does not see a podiatrist.Eye exam is current.  Thyroid Problem Presents for follow-up (- Her history includes partial left hemithyroidectomy in 2000 for nodular goiter.) visit. Patient reports no anxiety, cold intolerance, constipation, depressed mood, diarrhea, fatigue, heat intolerance, palpitations, tremors or weight loss. The symptoms have been stable. Her past medical history is significant for diabetes and hyperlipidemia.  Hyperlipidemia This is a chronic problem. The current episode started more than 1 year ago. The problem is controlled. Recent lipid tests were reviewed and are normal. Exacerbating diseases include chronic renal disease, diabetes, hypothyroidism and obesity. Factors aggravating her hyperlipidemia include thiazides. Pertinent negatives include no chest pain. Current antihyperlipidemic treatment includes statins. The current treatment provides moderate improvement of lipids. There are no compliance problems.  Risk factors for coronary artery disease include diabetes mellitus, dyslipidemia, hypertension, obesity and a sedentary lifestyle.  Hypertension This is a chronic problem. The problem has been resolved since onset. The problem is controlled. Pertinent negatives include no blurred vision, chest pain or palpitations. Agents associated with hypertension include thyroid hormones. Risk factors for coronary artery disease include diabetes mellitus, dyslipidemia, obesity and sedentary lifestyle. Past treatments include ACE inhibitors and diuretics. The current treatment provides moderate improvement. There are no compliance problems.  Hypertensive end-organ damage includes kidney disease. Identifiable causes of hypertension  include chronic renal disease and a thyroid problem.   Review of systems  Constitutional: + Minimally fluctuating body weight,  current Body mass index is 33.74 kg/m. , no fatigue, no subjective hyperthermia, no subjective hypothermia Eyes: no blurry vision, no xerophthalmia ENT: no sore throat, no nodules palpated in throat, no dysphagia/odynophagia, no hoarseness Cardiovascular: no chest pain, no shortness of breath, no palpitations, no leg swelling Respiratory: no cough, no shortness of breath Gastrointestinal: no nausea/vomiting/diarrhea Musculoskeletal: no muscle/joint aches Skin: no rashes, no hyperemia Neurological: no tremors, no numbness, no tingling, no dizziness Psychiatric: no depression, no anxiety  Objective:    BP 117/74    Pulse 74    Ht 5\' 7"  (1.702 m)    Wt 215 lb 6.4 oz (97.7 kg)    SpO2 99%    BMI 33.74 kg/m   Wt Readings from Last 3 Encounters:  11/22/21 215 lb 6.4 oz (97.7 kg)  05/25/21 218 lb 6.4 oz (99.1 kg)  11/19/20 221 lb (100.2 kg)    BP Readings from Last 3 Encounters:  11/22/21 117/74  05/25/21 100/69  11/19/20 132/79    Physical Exam- Limited  Constitutional:  Body mass index is 33.74 kg/m. , not in acute distress, normal state of mind Eyes:  EOMI, no exophthalmos Neck: Supple Thyroid: No gross goiter, + Scar from partial thyroidectomy in the past Cardiovascular: RRR, no murmurs, rubs, or gallops, no edema Respiratory: Adequate breathing efforts, no crackles, rales, rhonchi, or wheezing Musculoskeletal:  no gross deformities, strength intact in all four extremities, no gross restriction of joint movements Skin:  no rashes, no hyperemia Neurological: no tremor with outstretched hands  Foot exam:  No rashes, ulcers, cuts, calluses, onychodystrophy.  Good pulses bilat. Good sensation to 10 g monofilament bilat.  Recent Results (from the past 2160 hour(s))  Comprehensive metabolic panel     Status: Abnormal   Collection Time: 11/15/21  8:31  AM  Result Value Ref Range   Glucose, Bld 130 (H) 65 - 99 mg/dL    Comment: .            Fasting reference interval . For someone without known diabetes, a glucose value >125 mg/dL indicates that they may have diabetes and this should be confirmed with a follow-up test. .    BUN 12 7 - 25 mg/dL   Creat 0.78 0.50 - 1.05 mg/dL   BUN/Creatinine Ratio NOT APPLICABLE 6 - 22 (calc)   Sodium 142 135 - 146 mmol/L   Potassium 4.4 3.5 - 5.3 mmol/L   Chloride 105 98 - 110 mmol/L   CO2 27 20 - 32 mmol/L   Calcium 10.0 8.6 - 10.4 mg/dL   Total Protein 6.8 6.1 - 8.1 g/dL   Albumin 4.1 3.6 - 5.1 g/dL   Globulin 2.7 1.9 - 3.7 g/dL (calc)   AG Ratio 1.5 1.0 - 2.5 (calc)   Total Bilirubin 0.3 0.2 - 1.2 mg/dL   Alkaline phosphatase (APISO) 62 37 - 153 U/L   AST 21 10 - 35 U/L   ALT 13 6 - 29 U/L  Lipid panel     Status: None   Collection Time: 11/15/21  8:31 AM  Result Value Ref Range   Cholesterol 134 <200 mg/dL   HDL 50 > OR = 50 mg/dL   Triglycerides 80 <150 mg/dL   LDL Cholesterol (Calc) 68 mg/dL (calc)    Comment: Reference range: <100 . Desirable range <100 mg/dL for primary prevention;   <70 mg/dL for patients with CHD or diabetic patients  with > or = 2 CHD risk factors. Marland Kitchen LDL-C is now calculated using the Martin-Hopkins  calculation, which is a validated novel method providing  better accuracy than the Friedewald equation in the  estimation of LDL-C.  Cresenciano Genre et al. Annamaria Helling. [id number]): 2061-2068  (http://education.[facility].com/faq/FAQ164)    Total CHOL/HDL Ratio 2.7 <5.0 (calc)   Non-HDL Cholesterol (Calc) 84 <130 mg/dL (calc)    Comment: For patients with diabetes plus 1 major ASCVD risk  factor, treating to a non-HDL-C goal of <100 mg/dL  (LDL-C of <70 mg/dL) is considered a therapeutic  option.   T4, free     Status: None   Collection Time: 11/15/21  8:31 AM  Result Value Ref Range   Free T4 1.2 0.8 - 1.8 ng/dL  TSH     Status: None   Collection Time:  11/15/21  8:31 AM  Result Value Ref Range   TSH 1.59 0.40 - 4.50 mIU/L  VITAMIN D 25 Hydroxy (Vit-D Deficiency, Fractures)     Status: None   Collection Time: 11/15/21  8:31 AM  Result Value Ref Range   Vit D, 25-Hydroxy 68 30 - 100 ng/mL    Comment: Vitamin D Status         25-OH Vitamin D: . Deficiency:                    <20 ng/mL Insufficiency:  20 - 29 ng/mL Optimal:                 > or = 30 ng/mL . For 25-OH Vitamin D testing on patients on  D2-supplementation and patients for whom quantitation  of D2 and D3 fractions is required, the [facility](TM) 25-OH VIT D, (D2,D3), LC/MS/MS is recommended: order  code [phone] (patients >62yrs). See Note 1 . Note 1 . For additional information, please refer to  http://education.[facility].com/faq/FAQ199  (This link is being provided for informational/ educational purposes only.)   POCT glycosylated hemoglobin (Hb A1C)     Status: Normal   Collection Time: 11/22/21  3:47 PM  Result Value Ref Range   Hemoglobin A1C     HbA1c POC (<> result, manual entry)     HbA1c, POC (prediabetic range)     HbA1c, POC (controlled diabetic range) 6.3 0.0 - 7.0 %      Assessment & Plan:   1) Hypothyroidism- postsurgical -Her previsit thyroid function tests are consistent with appropriate hormone replacement.  She is advised to continue her current dose of Levothyroxine 75 mcg po daily before breakfast.    - We discussed about the correct intake of her thyroid hormone, on empty stomach at fasting, with water, separated by at least 30 minutes from breakfast and other medications,  and separated by more than 4 hours from calcium, iron, multivitamins, acid reflux medications (PPIs). -Patient is made aware of the fact that thyroid hormone replacement is needed for life, dose to be adjusted by periodic monitoring of thyroid function tests.  2) Controlled type 2 diabetes mellitus  - This patient has significant metabolic syndrome  including type 2 diabetes, dyslipidemia, hypertension, obesity, relative sedentary life.  She presents today with no meter or logs to review.  She does not routinely monitor glucose due to monotherapy of Metformin only.  Her POCT A1c today is 6.3%, improving from last visit of 6.8%.  She denies any s/s of hypoglycemia.  - Nutritional counseling repeated at each appointment due to patients tendency to fall back in to old habits.  - The patient admits there is a room for improvement in their diet and drink choices. -  Suggestion is made for the patient to avoid simple carbohydrates from their diet including Cakes, Sweet Desserts / Pastries, Ice Cream, Soda (diet and regular), Sweet Tea, Candies, Chips, Cookies, Sweet Pastries, Store Bought Juices, Alcohol in Excess of 1-2 drinks a day, Artificial Sweeteners, Coffee Creamer, and "Sugar-free" Products. This will help patient to have stable blood glucose profile and potentially avoid unintended weight gain.   - I encouraged the patient to switch to unprocessed or minimally processed complex starch and increased protein intake (animal or plant source), fruits, and vegetables.   - Patient is advised to stick to a routine mealtimes to eat 3 meals a day and avoid unnecessary snacks (to snack only to correct hypoglycemia).  -Given her controlled glycemic profile, she is advised to continue Metformin 500 mg po daily with breakfast and continue working on diet and exercise.  3) Nodular goiter -Her thyroid ultrasound from 03/23/17 shows surgically absent left thyroid lobe and isthmus and mildly enlarged right thyroid lobe without discrete nodules. -No dedicated follow up needed at this time.  4) Hypertension:  Her blood pressure is controlled to target.  She is advised to continue Lisinopril-HCT 10-12.5 mg po daily with breakfast.  5) Hyperlipidemia:  Her most recent lipid panel from 11/15/21 shows controlled LDL of 68.  She is advised  to continue Crestor 10  mg po daily at bedtime.  Side effects and precautions discussed with her.  6) Vitamin D deficiency: Her most recent vitamin D level was 68 on 11/15/21.  She is taking daily maintenance dose of Vitamin D3 5000 units PO daily OTC.  She is advised to continue per her other specialist.  - I advised patient to maintain close follow up with Ephriam Jenkins E for primary care needs.     I spent 30 minutes in the care of the patient today including review of labs from [place], Lipids, Thyroid Function, Hematology (current and previous including abstractions from other facilities); face-to-face time discussing  her blood glucose readings/logs, discussing hypoglycemia and hyperglycemia episodes and symptoms, medications doses, her options of short and long term treatment based on the latest standards of care / guidelines;  discussion about incorporating lifestyle medicine;  and documenting the encounter.    Please refer to Patient Instructions for Blood Glucose Monitoring and Insulin/Medications Dosing Guide"  in media tab for additional information. Please  also refer to " Patient Self Inventory" in the Media  tab for reviewed elements of pertinent patient history.  Denise Oneill participated in the discussions, expressed understanding, and voiced agreement with the above plans.  All questions were answered to her satisfaction. she is encouraged to contact clinic should she have any questions or concerns prior to her return visit.    Follow up plan: Return in about 6 months (around 05/25/2022) for Diabetes F/U with A1c in office, No previsit labs, Bring meter and logs.  Rayetta Pigg, [facility] [address] [place], [place] [address] Phone: [phone] Fax: [phone]  11/22/2021, 6:26 PM

## 2021-12-01 ENCOUNTER — Other Ambulatory Visit: Payer: Self-pay | Admitting: Nurse Practitioner

## 2021-12-09 ENCOUNTER — Other Ambulatory Visit: Payer: Self-pay | Admitting: Nurse Practitioner

## 2021-12-20 ENCOUNTER — Other Ambulatory Visit: Payer: Self-pay | Admitting: Nurse Practitioner

## 2022-01-04 ENCOUNTER — Other Ambulatory Visit: Payer: Self-pay | Admitting: "Endocrinology

## 2022-05-26 ENCOUNTER — Ambulatory Visit: Payer: BC Managed Care – PPO | Admitting: Nurse Practitioner

## 2022-05-26 ENCOUNTER — Encounter: Payer: Self-pay | Admitting: Nurse Practitioner

## 2022-05-26 VITALS — BP 123/82 | HR 75 | Ht 67.0 in | Wt 217.6 lb

## 2022-05-26 DIAGNOSIS — E119 Type 2 diabetes mellitus without complications: Secondary | ICD-10-CM

## 2022-05-26 DIAGNOSIS — E559 Vitamin D deficiency, unspecified: Secondary | ICD-10-CM

## 2022-05-26 DIAGNOSIS — I10 Essential (primary) hypertension: Secondary | ICD-10-CM | POA: Diagnosis not present

## 2022-05-26 DIAGNOSIS — E039 Hypothyroidism, unspecified: Secondary | ICD-10-CM

## 2022-05-26 DIAGNOSIS — E782 Mixed hyperlipidemia: Secondary | ICD-10-CM

## 2022-05-26 LAB — POCT GLYCOSYLATED HEMOGLOBIN (HGB A1C): Hemoglobin A1C: 6.3 % — AB (ref 4.0–5.6)

## 2022-05-26 NOTE — Progress Notes (Signed)
05/26/2022  Endocrinology follow-up note  Subjective:    Patient ID: Denise Oneill, female    DOB: 09/01/1961, PCP Richarda Blade E   Past Medical History:  Diagnosis Date   Asthma    Diabetes mellitus without complication (HCC)    Goiter    Hypertension    Hypothyroidism    Past Surgical History:  Procedure Laterality Date   COLONOSCOPY N/A 02/07/2018   Procedure: COLONOSCOPY;  Surgeon: Malissa Hippo, MD;  Location: [facility];  Service: Endoscopy;  Laterality: N/A;  830   Partial Thyroidiectomy     Social History   Socioeconomic History   Marital status: Married    Spouse name: Not on file   Number of children: Not on file   Years of education: Not on file   Highest education level: Not on file  Occupational History   Not on file  Tobacco Use   Smoking status: Never   Smokeless tobacco: Never  Vaping Use   Vaping Use: Never used  Substance and Sexual Activity   Alcohol use: No   Drug use: No   Sexual activity: Not on file  Other Topics Concern   Not on file  Social History Narrative   Not on file   Social Determinants of Health   Financial Resource Strain: Not on file  Food Insecurity: Not on file  Transportation Needs: Not on file  Physical Activity: Not on file  Stress: Not on file  Social Connections: Not on file   Outpatient Encounter Medications as of 05/26/2022  Medication Sig   albuterol (PROVENTIL HFA;VENTOLIN HFA) 108 (90 Base) MCG/ACT inhaler Inhale 2 puffs into the lungs every 6 (six) hours as needed for wheezing or shortness of breath.   anastrozole (ARIMIDEX) 1 MG tablet Take 1 mg by mouth daily.   Calcium Carbonate-Vit D-Min (CALCIUM 1200 PO) Take by mouth daily.   levothyroxine (SYNTHROID) 75 MCG tablet TAKE 1 TABLET BY MOUTH EVERY DAY BEFORE BREAKFAST   lisinopril-hydrochlorothiazide (ZESTORETIC) 10-12.5 MG tablet TAKE 1 TABLET BY MOUTH EVERY DAY   metFORMIN (GLUCOPHAGE) 500 MG tablet TAKE 1 TABLET BY MOUTH EVERY DAY WITH  BREAKFAST   rosuvastatin (CRESTOR) 10 MG tablet TAKE 1 TABLET BY MOUTH EVERY DAY   Vitamin D, Ergocalciferol, (DRISDOL) 1.25 MG (50000 UNIT) CAPS capsule Take 1 capsule (50,000 Units total) by mouth every 7 (seven) days.   meloxicam (MOBIC) 15 MG tablet Take 15 mg by mouth daily. (Patient not taking: Reported on 05/26/2022)   No facility-administered encounter medications on file as of 05/26/2022.   ALLERGIES: Allergies  Allergen Reactions   Penicillins Hives    Has patient had a PCN reaction causing immediate rash, facial/tongue/throat swelling, SOB or lightheadedness with hypotension: Yes Has patient had a PCN reaction causing severe rash involving mucus membranes or skin necrosis: No Has patient had a PCN reaction that required hospitalization: Yes Has patient had a PCN reaction occurring within the last 10 years: No If all of the above answers are "NO", then may proceed with Cephalosporin use.   Sugar-Protein-Starch     Artificial sweeteners: give her a headache    VACCINATION STATUS:  There is no immunization history on file for this patient.  Diabetes She presents for her follow-up diabetic visit. She has type 2 diabetes mellitus. Her disease course has been stable. Pertinent negatives for hypoglycemia include no nervousness/anxiousness or tremors. Pertinent negatives for diabetes include no blurred vision, no chest pain, no fatigue, no polydipsia, no polyphagia, no  polyuria and no weight loss. There are no hypoglycemic complications. Symptoms are stable. Diabetic complications include nephropathy. Risk factors for coronary artery disease include diabetes mellitus, dyslipidemia, obesity, hypertension and sedentary lifestyle. Current diabetic treatment includes oral agent (monotherapy). She is compliant with treatment most of the time. Her weight is fluctuating minimally. She is following a generally healthy diet. When asked about meal planning, she reported none. She has not had a previous  visit with a dietitian. She participates in exercise three times a week. (She presents today with no meter or logs to review.  She does not routinely monitor glucose due to monotherapy of Metformin only.  Her POCT A1c today is 6.3%, unchanged from previous visit. She denies any s/s of hypoglycemia. ) An ACE inhibitor/angiotensin II receptor blocker is being taken. She does not see a podiatrist.Eye exam is current.  Thyroid Problem Presents for follow-up (- Her history includes partial left hemithyroidectomy in 2000 for nodular goiter.) visit. Patient reports no anxiety, cold intolerance, constipation, depressed mood, diarrhea, fatigue, heat intolerance, palpitations, tremors or weight loss. The symptoms have been stable. Her past medical history is significant for diabetes and hyperlipidemia.  Hyperlipidemia This is a chronic problem. The current episode started more than 1 year ago. The problem is controlled. Recent lipid tests were reviewed and are normal. Exacerbating diseases include chronic renal disease, diabetes, hypothyroidism and obesity. Factors aggravating her hyperlipidemia include thiazides. Pertinent negatives include no chest pain. Current antihyperlipidemic treatment includes statins. The current treatment provides moderate improvement of lipids. There are no compliance problems.  Risk factors for coronary artery disease include diabetes mellitus, dyslipidemia, hypertension, obesity and a sedentary lifestyle.  Hypertension This is a chronic problem. The problem has been resolved since onset. The problem is controlled. Pertinent negatives include no blurred vision, chest pain or palpitations. Agents associated with hypertension include thyroid hormones. Risk factors for coronary artery disease include diabetes mellitus, dyslipidemia, obesity and sedentary lifestyle. Past treatments include ACE inhibitors and diuretics. The current treatment provides moderate improvement. There are no compliance  problems.  Hypertensive end-organ damage includes kidney disease. Identifiable causes of hypertension include chronic renal disease and a thyroid problem.    Review of systems  Constitutional: + Minimally fluctuating body weight,  current Body mass index is 34.08 kg/m. , no fatigue, no subjective hyperthermia, no subjective hypothermia Eyes: no blurry vision, no xerophthalmia ENT: no sore throat, no nodules palpated in throat, no dysphagia/odynophagia, no hoarseness Cardiovascular: no chest pain, no shortness of breath, no palpitations, no leg swelling Respiratory: no cough, no shortness of breath Gastrointestinal: no nausea/vomiting/diarrhea Musculoskeletal: no muscle/joint aches Skin: no rashes, no hyperemia Neurological: no tremors, no numbness, no tingling, no dizziness Psychiatric: no depression, no anxiety  Objective:    BP 123/82 (BP Location: Right Arm, Patient Position: Sitting, Cuff Size: Large)   Pulse 75   Ht 5\' 7"  (1.702 m)   Wt 217 lb 9.6 oz (98.7 kg)   BMI 34.08 kg/m   Wt Readings from Last 3 Encounters:  05/26/22 217 lb 9.6 oz (98.7 kg)  11/22/21 215 lb 6.4 oz (97.7 kg)  05/25/21 218 lb 6.4 oz (99.1 kg)    BP Readings from Last 3 Encounters:  05/26/22 123/82  11/22/21 117/74  05/25/21 100/69    Physical Exam- Limited  Constitutional:  Body mass index is 34.08 kg/m. , not in acute distress, normal state of mind Eyes:  EOMI, no exophthalmos Neck: Supple Thyroid: No gross goiter, + Scar from partial thyroidectomy in the past  Cardiovascular: RRR, no murmurs, rubs, or gallops, no edema Respiratory: Adequate breathing efforts, no crackles, rales, rhonchi, or wheezing Musculoskeletal: no gross deformities, strength intact in all four extremities, no gross restriction of joint movements Skin:  no rashes, no hyperemia Neurological: no tremor with outstretched hands   Recent Results (from the past 2160 hour(s))  HgB A1c     Status: Abnormal   Collection Time:  05/26/22  3:44 PM  Result Value Ref Range   Hemoglobin A1C 6.3 (A) 4.0 - 5.6 %   HbA1c POC (<> result, manual entry)     HbA1c, POC (prediabetic range)     HbA1c, POC (controlled diabetic range)        Assessment & Plan:   1) Hypothyroidism- postsurgical -There are no recent TFTs to review.  She is advised to continue her current dose of Levothyroxine 75 mcg po daily before breakfast.  Will recheck TFTs prior to next visit and adjust dose if needed.   - We discussed about the correct intake of her thyroid hormone, on empty stomach at fasting, with water, separated by at least 30 minutes from breakfast and other medications,  and separated by more than 4 hours from calcium, iron, multivitamins, acid reflux medications (PPIs). -Patient is made aware of the fact that thyroid hormone replacement is needed for life, dose to be adjusted by periodic monitoring of thyroid function tests.  2) Controlled type 2 diabetes mellitus  - This patient has significant metabolic syndrome including type 2 diabetes, dyslipidemia, hypertension, obesity, relative sedentary life.  She presents today with no meter or logs to review.  She does not routinely monitor glucose due to monotherapy of Metformin only.  Her POCT A1c today is 6.3%, unchanged from previous visit. She denies any s/s of hypoglycemia.  She has also started working out twice per week at the gym.  - Nutritional counseling repeated at each appointment due to patients tendency to fall back in to old habits.  - The patient admits there is a room for improvement in their diet and drink choices. -  Suggestion is made for the patient to avoid simple carbohydrates from their diet including Cakes, Sweet Desserts / Pastries, Ice Cream, Soda (diet and regular), Sweet Tea, Candies, Chips, Cookies, Sweet Pastries, Store Bought Juices, Alcohol in Excess of 1-2 drinks a day, Artificial Sweeteners, Coffee Creamer, and "Sugar-free" Products. This will help patient  to have stable blood glucose profile and potentially avoid unintended weight gain.   - I encouraged the patient to switch to unprocessed or minimally processed complex starch and increased protein intake (animal or plant source), fruits, and vegetables.   - Patient is advised to stick to a routine mealtimes to eat 3 meals a day and avoid unnecessary snacks (to snack only to correct hypoglycemia).  -Given her controlled glycemic profile, she is advised to continue Metformin 500 mg po daily with breakfast and continue working on diet and exercise.  3) Nodular goiter -Her thyroid ultrasound from 03/23/17 shows surgically absent left thyroid lobe and isthmus and mildly enlarged right thyroid lobe without discrete nodules. -No dedicated follow up needed at this time.  4) Hypertension:  Her blood pressure is controlled to target.  She is advised to continue Lisinopril-HCT 10-12.5 mg po daily with breakfast.  5) Hyperlipidemia:  Her most recent lipid panel from 11/15/21 shows controlled LDL of 68.  She is advised to continue Crestor 10 mg po daily at bedtime.  Side effects and precautions discussed with her.  Will  recheck lipid panel prior to next visit.  6) Vitamin D deficiency: Her most recent vitamin D level was 68 on 11/15/21.  She is taking daily maintenance dose of Vitamin D3 5000 units PO daily OTC.  She is advised to continue per her other specialist.  Will recheck vitamin D level prior to next visit.  - I advised patient to maintain close follow up with Richarda Blade E for primary care needs.      I spent 40 minutes in the care of the patient today including review of labs from CMP, Lipids, Thyroid Function, Hematology (current and previous including abstractions from other facilities); face-to-face time discussing  her blood glucose readings/logs, discussing hypoglycemia and hyperglycemia episodes and symptoms, medications doses, her options of short and long term treatment based on the  latest standards of care / guidelines;  discussion about incorporating lifestyle medicine;  and documenting the encounter. Risk reduction counseling performed per USPSTF guidelines to reduce obesity and cardiovascular risk factors.     Please refer to Patient Instructions for Blood Glucose Monitoring and Insulin/Medications Dosing Guide"  in media tab for additional information. Please  also refer to " Patient Self Inventory" in the Media  tab for reviewed elements of pertinent patient history.  Denise Oneill participated in the discussions, expressed understanding, and voiced agreement with the above plans.  All questions were answered to her satisfaction. she is encouraged to contact clinic should she have any questions or concerns prior to her return visit.    Follow up plan: Return in about 6 months (around 11/24/2022) for Diabetes F/U with A1c in office, Previsit labs, Bring meter and logs.  Ronny Bacon, [facility] [address] [place], [place] [address] Phone: [phone] Fax: [phone]  05/26/2022, 4:21 PM

## 2022-06-02 ENCOUNTER — Other Ambulatory Visit: Payer: Self-pay | Admitting: "Endocrinology

## 2022-06-05 ENCOUNTER — Other Ambulatory Visit: Payer: Self-pay | Admitting: Nurse Practitioner

## 2022-06-29 ENCOUNTER — Other Ambulatory Visit: Payer: Self-pay | Admitting: Nurse Practitioner

## 2022-07-19 ENCOUNTER — Other Ambulatory Visit: Payer: Self-pay | Admitting: Nurse Practitioner

## 2022-08-28 ENCOUNTER — Other Ambulatory Visit: Payer: Self-pay | Admitting: "Endocrinology

## 2022-11-19 LAB — COMPREHENSIVE METABOLIC PANEL
ALT: 26 IU/L (ref 0–32)
AST: 25 IU/L (ref 0–40)
Albumin/Globulin Ratio: 1.9 (ref 1.2–2.2)
Albumin: 4.4 g/dL (ref 3.9–4.9)
Alkaline Phosphatase: 80 IU/L (ref 44–121)
BUN/Creatinine Ratio: 13 (ref 12–28)
BUN: 10 mg/dL (ref 8–27)
Bilirubin Total: 0.3 mg/dL (ref 0.0–1.2)
CO2: 24 mmol/L (ref 20–29)
Calcium: 10.2 mg/dL (ref 8.7–10.3)
Chloride: 100 mmol/L (ref 96–106)
Creatinine, Ser: 0.76 mg/dL (ref 0.57–1.00)
Globulin, Total: 2.3 g/dL (ref 1.5–4.5)
Glucose: 137 mg/dL — ABNORMAL HIGH (ref 70–99)
Potassium: 4.6 mmol/L (ref 3.5–5.2)
Sodium: 140 mmol/L (ref 134–144)
Total Protein: 6.7 g/dL (ref 6.0–8.5)
eGFR: 88 mL/min/{1.73_m2} (ref >59)

## 2022-11-19 LAB — LIPID PANEL
Chol/HDL Ratio: 4 ratio (ref 0.0–4.4)
Cholesterol, Total: 162 mg/dL (ref 100–199)
HDL: 41 mg/dL (ref >39)
LDL Chol Calc (NIH): 96 mg/dL (ref 0–99)
Triglycerides: 142 mg/dL (ref 0–149)
VLDL Cholesterol Cal: 25 mg/dL (ref 5–40)

## 2022-11-19 LAB — TSH: TSH: 0.95 u[IU]/mL (ref 0.450–4.500)

## 2022-11-19 LAB — VITAMIN D 25 HYDROXY (VIT D DEFICIENCY, FRACTURES): Vit D, 25-Hydroxy: 72.1 ng/mL (ref 30.0–100.0)

## 2022-11-19 LAB — T4, FREE: Free T4: 1.52 ng/dL (ref 0.82–1.77)

## 2022-11-23 NOTE — Patient Instructions (Signed)

## 2022-11-24 ENCOUNTER — Ambulatory Visit: Payer: BC Managed Care – PPO | Admitting: Nurse Practitioner

## 2022-11-24 ENCOUNTER — Encounter: Payer: Self-pay | Admitting: Nurse Practitioner

## 2022-11-24 ENCOUNTER — Other Ambulatory Visit: Payer: Self-pay | Admitting: "Endocrinology

## 2022-11-24 VITALS — BP 132/86 | HR 72 | Ht 67.0 in | Wt 214.0 lb

## 2022-11-24 DIAGNOSIS — E119 Type 2 diabetes mellitus without complications: Secondary | ICD-10-CM | POA: Diagnosis not present

## 2022-11-24 DIAGNOSIS — I10 Essential (primary) hypertension: Secondary | ICD-10-CM

## 2022-11-24 DIAGNOSIS — E039 Hypothyroidism, unspecified: Secondary | ICD-10-CM

## 2022-11-24 DIAGNOSIS — E559 Vitamin D deficiency, unspecified: Secondary | ICD-10-CM

## 2022-11-24 LAB — POCT GLYCOSYLATED HEMOGLOBIN (HGB A1C): HbA1c, POC (controlled diabetic range): 6.9 % (ref 0.0–7.0)

## 2022-11-24 LAB — POCT UA - MICROALBUMIN
Albumin/Creatinine Ratio, Urine, POC: <30
Creatinine, POC: 200 mg/dL
Microalbumin Ur, POC: 80 mg/L

## 2022-11-24 MED ORDER — METFORMIN HCL 500 MG PO TABS: 500.0000 mg | ORAL_TABLET | Freq: Every day | ORAL | 3 refills | Status: DC

## 2022-11-24 MED ORDER — LEVOTHYROXINE SODIUM 75 MCG PO TABS: 75.0000 ug | ORAL_TABLET | Freq: Every day | ORAL | 3 refills | Status: DC

## 2022-11-24 MED ORDER — ROSUVASTATIN CALCIUM 10 MG PO TABS: 10.0000 mg | ORAL_TABLET | Freq: Every day | ORAL | 3 refills | Status: DC

## 2022-11-24 MED ORDER — VITAMIN D (ERGOCALCIFEROL) 1.25 MG (50000 UNIT) PO CAPS: 50000.0000 [IU] | ORAL_CAPSULE | ORAL | 3 refills | Status: AC

## 2022-11-24 NOTE — Progress Notes (Signed)
11/24/2022  Endocrinology follow-up note  Subjective:    Patient ID: Denise Oneill, female    DOB: September 26, 1960, PCP Ephriam Jenkins E   Past Medical History:  Diagnosis Date   Asthma    Diabetes mellitus without complication ([place])    Goiter    Hypertension    Hypothyroidism    Past Surgical History:  Procedure Laterality Date   COLONOSCOPY N/A 02/07/2018   Procedure: COLONOSCOPY;  Surgeon: Rogene Houston, MD;  Location: [facility];  Service: Endoscopy;  Laterality: N/A;  830   Partial Thyroidiectomy     Social History   Socioeconomic History   Marital status: Married    Spouse name: Not on file   Number of children: Not on file   Years of education: Not on file   Highest education level: Not on file  Occupational History   Not on file  Tobacco Use   Smoking status: Never   Smokeless tobacco: Never  Vaping Use   Vaping Use: Never used  Substance and Sexual Activity   Alcohol use: No   Drug use: No   Sexual activity: Not on file  Other Topics Concern   Not on file  Social History Narrative   Not on file   Social Determinants of Health   Financial Resource Strain: Not on file  Food Insecurity: Not on file  Transportation Needs: Not on file  Physical Activity: Not on file  Stress: Not on file  Social Connections: Not on file   Outpatient Encounter Medications as of 11/24/2022  Medication Sig   albuterol (PROVENTIL HFA;VENTOLIN HFA) 108 (90 Base) MCG/ACT inhaler Inhale 2 puffs into the lungs every 6 (six) hours as needed for wheezing or shortness of breath.   anastrozole (ARIMIDEX) 1 MG tablet Take 1 mg by mouth daily.   Calcium Carbonate-Vit D-Min (CALCIUM 1200 PO) Take by mouth daily.   levothyroxine (SYNTHROID) 75 MCG tablet Take 1 tablet (75 mcg total) by mouth daily before breakfast.   lisinopril-hydrochlorothiazide (ZESTORETIC) 10-12.5 MG tablet TAKE 1 TABLET BY MOUTH EVERY DAY   metFORMIN (GLUCOPHAGE) 500 MG tablet Take 1 tablet (500 mg total)  by mouth daily with breakfast.   rosuvastatin (CRESTOR) 10 MG tablet Take 1 tablet (10 mg total) by mouth daily.   Vitamin D, Ergocalciferol, (DRISDOL) 1.25 MG (50000 UNIT) CAPS capsule Take 1 capsule (50,000 Units total) by mouth every 7 (seven) days.   [DISCONTINUED] levothyroxine (SYNTHROID) 75 MCG tablet TAKE 1 TABLET BY MOUTH EVERY DAY BEFORE BREAKFAST   [DISCONTINUED] meloxicam (MOBIC) 15 MG tablet Take 15 mg by mouth daily. (Patient not taking: Reported on 05/26/2022)   [DISCONTINUED] metFORMIN (GLUCOPHAGE) 500 MG tablet TAKE 1 TABLET BY MOUTH EVERY DAY WITH BREAKFAST   [DISCONTINUED] metFORMIN (GLUCOPHAGE) 500 MG tablet TAKE 1 TABLET BY MOUTH EVERY DAY WITH BREAKFAST   [DISCONTINUED] rosuvastatin (CRESTOR) 10 MG tablet TAKE 1 TABLET BY MOUTH EVERY DAY   [DISCONTINUED] Vitamin D, Ergocalciferol, (DRISDOL) 1.25 MG (50000 UNIT) CAPS capsule Take 1 capsule (50,000 Units total) by mouth every 7 (seven) days.   No facility-administered encounter medications on file as of 11/24/2022.   ALLERGIES: Allergies  Allergen Reactions   Penicillins Hives    Has patient had a PCN reaction causing immediate rash, facial/tongue/throat swelling, SOB or lightheadedness with hypotension: Yes Has patient had a PCN reaction causing severe rash involving mucus membranes or skin necrosis: No Has patient had a PCN reaction that required hospitalization: Yes Has patient had a PCN reaction  occurring within the last 10 years: No If all of the above answers are "NO", then may proceed with Cephalosporin use.   Sugar-Protein-Starch     Artificial sweeteners: give her a headache    VACCINATION STATUS: Immunization History  Administered Date(s) Administered   Moderna Covid-19 Vaccine Bivalent Booster 21yr & up 09/07/2021   Moderna Sars-Covid-2 Vaccination 11/22/2019, 12/20/2019, 09/17/2020, 04/01/2021    Diabetes She presents for her follow-up diabetic visit. She has type 2 diabetes mellitus. Her disease course  has been stable. Pertinent negatives for hypoglycemia include no nervousness/anxiousness or tremors. Associated symptoms include weight loss. Pertinent negatives for diabetes include no blurred vision, no chest pain, no fatigue, no polydipsia, no polyphagia and no polyuria. There are no hypoglycemic complications. Symptoms are stable. Diabetic complications include nephropathy. Risk factors for coronary artery disease include diabetes mellitus, dyslipidemia, obesity, hypertension and sedentary lifestyle. Current diabetic treatment includes oral agent (monotherapy). She is compliant with treatment most of the time. Her weight is fluctuating minimally. She is following a generally healthy diet. When asked about meal planning, she reported none. She has not had a previous visit with a dietitian. She participates in exercise three times a week. (She presents today with no meter or logs to review.  She does not routinely monitor glucose due to monotherapy of Metformin only.  Her POCT A1c today is 6.9%, increasing slightly from previous visit but she does note she has has sinus issues going on 2 weeks for which she has been on medications. She denies any s/s of hypoglycemia. ) An ACE inhibitor/angiotensin II receptor blocker is being taken. She does not see a podiatrist.Eye exam is current.  Thyroid Problem Presents for follow-up (- Her history includes partial left hemithyroidectomy in 2000 for nodular goiter.) visit. Symptoms include weight loss. Patient reports no anxiety, cold intolerance, constipation, depressed mood, diarrhea, fatigue, heat intolerance, palpitations or tremors. The symptoms have been stable. Her past medical history is significant for diabetes and hyperlipidemia.  Hyperlipidemia This is a chronic problem. The current episode started more than 1 year ago. The problem is controlled. Recent lipid tests were reviewed and are normal. Exacerbating diseases include chronic renal disease, diabetes,  hypothyroidism and obesity. Factors aggravating her hyperlipidemia include thiazides. Pertinent negatives include no chest pain. Current antihyperlipidemic treatment includes statins. The current treatment provides moderate improvement of lipids. There are no compliance problems.  Risk factors for coronary artery disease include diabetes mellitus, dyslipidemia, hypertension, obesity and a sedentary lifestyle.  Hypertension This is a chronic problem. The problem has been resolved since onset. The problem is controlled. Pertinent negatives include no blurred vision, chest pain or palpitations. Agents associated with hypertension include thyroid hormones. Risk factors for coronary artery disease include diabetes mellitus, dyslipidemia, obesity and sedentary lifestyle. Past treatments include ACE inhibitors and diuretics. The current treatment provides moderate improvement. There are no compliance problems.  Hypertensive end-organ damage includes kidney disease. Identifiable causes of hypertension include chronic renal disease and a thyroid problem.    Review of systems  Constitutional: + Minimally fluctuating body weight,  current Body mass index is 33.52 kg/m. , no fatigue, no subjective hyperthermia, no subjective hypothermia Eyes: no blurry vision, no xerophthalmia ENT: no sore throat, no nodules palpated in throat, no dysphagia/odynophagia, no hoarseness Cardiovascular: no chest pain, no shortness of breath, no palpitations, no leg swelling Respiratory: no cough, no shortness of breath Gastrointestinal: no nausea/vomiting/diarrhea Musculoskeletal: no muscle/joint aches Skin: no rashes, no hyperemia Neurological: no tremors, no numbness, no tingling, no dizziness  Psychiatric: no depression, no anxiety  Objective:    BP 132/86   Pulse 72   Ht '5\' 7"'$  (1.702 m)   Wt 214 lb (97.1 kg)   BMI 33.52 kg/m   Wt Readings from Last 3 Encounters:  11/24/22 214 lb (97.1 kg)  05/26/22 217 lb 9.6 oz  (98.7 kg)  11/22/21 215 lb 6.4 oz (97.7 kg)    BP Readings from Last 3 Encounters:  11/24/22 132/86  05/26/22 123/82  11/22/21 117/74    Physical Exam- Limited  Constitutional:  Body mass index is 33.52 kg/m. , not in acute distress, normal state of mind Eyes:  EOMI, no exophthalmos Thyroid: No gross goiter, + Scar from partial thyroidectomy in the past Musculoskeletal: no gross deformities, strength intact in all four extremities, no gross restriction of joint movements Skin:  no rashes, no hyperemia Neurological: no tremor with outstretched hands  Diabetic Foot Exam - Simple   Simple Foot Form Diabetic Foot exam was performed with the following findings: Yes 11/24/2022  1:51 PM  Visual Inspection No deformities, no ulcerations, no other skin breakdown bilaterally: Yes Sensation Testing Intact to touch and monofilament testing bilaterally: Yes Pulse Check Posterior Tibialis and Dorsalis pulse intact bilaterally: Yes Comments     Recent Results (from the past 2160 hour(s))  Comprehensive metabolic panel     Status: Abnormal   Collection Time: 11/18/22  8:06 AM  Result Value Ref Range   Glucose 137 (H) 70 - 99 mg/dL   BUN 10 8 - 27 mg/dL   Creatinine, Ser 0.76 0.57 - 1.00 mg/dL   eGFR 88 >59 mL/min/1.73   BUN/Creatinine Ratio 13 12 - 28   Sodium 140 134 - 144 mmol/L   Potassium 4.6 3.5 - 5.2 mmol/L   Chloride 100 96 - 106 mmol/L   CO2 24 20 - 29 mmol/L   Calcium 10.2 8.7 - 10.3 mg/dL   Total Protein 6.7 6.0 - 8.5 g/dL   Albumin 4.4 3.9 - 4.9 g/dL   Globulin, Total 2.3 1.5 - 4.5 g/dL   Albumin/Globulin Ratio 1.9 1.2 - 2.2   Bilirubin Total 0.3 0.0 - 1.2 mg/dL   Alkaline Phosphatase 80 44 - 121 IU/L   AST 25 0 - 40 IU/L   ALT 26 0 - 32 IU/L  Lipid panel     Status: None   Collection Time: 11/18/22  8:06 AM  Result Value Ref Range   Cholesterol, Total 162 100 - 199 mg/dL   Triglycerides 142 0 - 149 mg/dL   HDL 41 >39 mg/dL   VLDL Cholesterol Cal 25 5 - 40 mg/dL    LDL Chol Calc (NIH) 96 0 - 99 mg/dL   Chol/HDL Ratio 4.0 0.0 - 4.4 ratio    Comment:                                   T. Chol/HDL Ratio                                             Men  Women                               1/2 Avg.Risk  3.4    3.3  Avg.Risk  5.0    4.4                                2X Avg.Risk  9.6    7.1                                3X Avg.Risk 23.4   11.0   TSH     Status: None   Collection Time: 11/18/22  8:06 AM  Result Value Ref Range   TSH 0.950 0.450 - 4.500 uIU/mL  T4, free     Status: None   Collection Time: 11/18/22  8:06 AM  Result Value Ref Range   Free T4 1.52 0.82 - 1.77 ng/dL  VITAMIN D 25 Hydroxy (Vit-D Deficiency, Fractures)     Status: None   Collection Time: 11/18/22  8:06 AM  Result Value Ref Range   Vit D, 25-Hydroxy 72.1 30.0 - 100.0 ng/mL    Comment: Vitamin D deficiency has been defined by the [place] and an Endocrine Society practice guideline as a level of serum 25-OH vitamin D less than 20 ng/mL (1,2). The Endocrine Society went on to further define vitamin D insufficiency as a level between 21 and 29 ng/mL (2). 1. IOM (Institute of Medicine). 2010. Dietary reference    intakes for calcium and D. [place]: The    [employer]. 2. Holick MF, Binkley Grasston, Bischoff-Ferrari HA, et al.    Evaluation, treatment, and prevention of vitamin D    deficiency: an Endocrine Society clinical practice    guideline. JCEM. 2011 Jul; 96(7):1911-30.   POCT UA - Microalbumin     Status: Normal   Collection Time: 11/24/22  1:58 PM  Result Value Ref Range   Microalbumin Ur, POC 80 mg/L   Creatinine, POC 200 mg/dL   Albumin/Creatinine Ratio, Urine, POC <30   HgB A1c     Status: Normal   Collection Time: 11/24/22  1:58 PM  Result Value Ref Range   Hemoglobin A1C     HbA1c POC (<> result, manual entry)     HbA1c, POC (prediabetic range)     HbA1c, POC (controlled diabetic range) 6.9  0.0 - 7.0 %      Assessment & Plan:   1) Hypothyroidism- postsurgical -Her previsit thyroid function tests are consistent with appropriate hormone replacement.  She is advised to continue her current dose of Levothyroxine 75 mcg po daily before breakfast.     - We discussed about the correct intake of her thyroid hormone, on empty stomach at fasting, with water, separated by at least 30 minutes from breakfast and other medications,  and separated by more than 4 hours from calcium, iron, multivitamins, acid reflux medications (PPIs). -Patient is made aware of the fact that thyroid hormone replacement is needed for life, dose to be adjusted by periodic monitoring of thyroid function tests.  2) Controlled type 2 diabetes mellitus  - This patient has significant metabolic syndrome including type 2 diabetes, dyslipidemia, hypertension, obesity, relative sedentary life.  She presents today with no meter or logs to review.  She does not routinely monitor glucose due to monotherapy of Metformin only.  Her POCT A1c today is 6.9%, increasing slightly from previous visit but she does note she has has sinus issues going on 2 weeks for which she has been on medications. She denies  any s/s of hypoglycemia.   - Nutritional counseling repeated at each appointment due to patients tendency to fall back in to old habits.  - The patient admits there is a room for improvement in their diet and drink choices. -  Suggestion is made for the patient to avoid simple carbohydrates from their diet including Cakes, Sweet Desserts / Pastries, Ice Cream, Soda (diet and regular), Sweet Tea, Candies, Chips, Cookies, Sweet Pastries, Store Bought Juices, Alcohol in Excess of 1-2 drinks a day, Artificial Sweeteners, Coffee Creamer, and "Sugar-free" Products. This will help patient to have stable blood glucose profile and potentially avoid unintended weight gain.   - I encouraged the patient to switch to unprocessed or minimally  processed complex starch and increased protein intake (animal or plant source), fruits, and vegetables.   - Patient is advised to stick to a routine mealtimes to eat 3 meals a day and avoid unnecessary snacks (to snack only to correct hypoglycemia).  -Given her controlled glycemic profile, she is advised to continue Metformin 500 mg po daily with breakfast and continue working on diet and exercise.  3) Nodular goiter -Her thyroid ultrasound from 03/23/17 shows surgically absent left thyroid lobe and isthmus and mildly enlarged right thyroid lobe without discrete nodules. -No dedicated follow up needed at this time.  4) Hypertension:  Her blood pressure is controlled to target.  She is advised to continue Lisinopril-HCT 10-12.5 mg po daily with breakfast.  5) Hyperlipidemia:  Her most recent lipid panel from 11/18/22 shows controlled LDL of 96.  She is advised to continue Crestor 10 mg po daily at bedtime.  Side effects and precautions discussed with her.    6) Vitamin D deficiency: Her most recent vitamin D level was 72.1 on 11/18/22.  She is taking daily maintenance dose of Vitamin D3 5000 units PO daily OTC.  She is advised to continue per her other specialist.     - I advised patient to maintain close follow up with Ephriam Jenkins E for primary care needs.      I spent  51  minutes in the care of the patient today including review of labs from [place], Lipids, Thyroid Function, Hematology (current and previous including abstractions from other facilities); face-to-face time discussing  her blood glucose readings/logs, discussing hypoglycemia and hyperglycemia episodes and symptoms, medications doses, her options of short and long term treatment based on the latest standards of care / guidelines;  discussion about incorporating lifestyle medicine;  and documenting the encounter. Risk reduction counseling performed per USPSTF guidelines to reduce obesity and cardiovascular risk factors.      Please refer to Patient Instructions for Blood Glucose Monitoring and Insulin/Medications Dosing Guide"  in media tab for additional information. Please  also refer to " Patient Self Inventory" in the Media  tab for reviewed elements of pertinent patient history.  Denise Oneill participated in the discussions, expressed understanding, and voiced agreement with the above plans.  All questions were answered to her satisfaction. she is encouraged to contact clinic should she have any questions or concerns prior to her return visit.    Follow up plan: Return in about 6 months (around 05/27/2023) for Diabetes F/U with A1c in office, Previsit labs, Thyroid follow up.  Rayetta Pigg, [facility] [address] [place], [place] [address] Phone: [phone] Fax: [phone]  11/24/2022, 2:11 PM

## 2023-01-05 ENCOUNTER — Other Ambulatory Visit: Payer: Self-pay | Admitting: Nurse Practitioner

## 2023-05-29 ENCOUNTER — Encounter: Payer: Self-pay | Admitting: Nurse Practitioner

## 2023-05-29 ENCOUNTER — Ambulatory Visit (INDEPENDENT_AMBULATORY_CARE_PROVIDER_SITE_OTHER): Payer: BC Managed Care – PPO | Admitting: Nurse Practitioner

## 2023-05-29 VITALS — BP 117/71 | HR 69 | Ht 67.0 in | Wt 213.6 lb

## 2023-05-29 DIAGNOSIS — E039 Hypothyroidism, unspecified: Secondary | ICD-10-CM

## 2023-05-29 DIAGNOSIS — Z7984 Long term (current) use of oral hypoglycemic drugs: Secondary | ICD-10-CM

## 2023-05-29 DIAGNOSIS — E559 Vitamin D deficiency, unspecified: Secondary | ICD-10-CM | POA: Diagnosis not present

## 2023-05-29 DIAGNOSIS — E782 Mixed hyperlipidemia: Secondary | ICD-10-CM

## 2023-05-29 DIAGNOSIS — I10 Essential (primary) hypertension: Secondary | ICD-10-CM

## 2023-05-29 DIAGNOSIS — E119 Type 2 diabetes mellitus without complications: Secondary | ICD-10-CM

## 2023-05-29 LAB — POCT GLYCOSYLATED HEMOGLOBIN (HGB A1C): Hemoglobin A1C: 6.5 % — AB (ref 4.0–5.6)

## 2023-05-29 MED ORDER — ALBUTEROL SULFATE HFA 108 (90 BASE) MCG/ACT IN AERS: 2.0000 | INHALATION_SPRAY | Freq: Four times a day (QID) | RESPIRATORY_TRACT | 4 refills | Status: AC | PRN

## 2023-05-29 NOTE — Progress Notes (Signed)
05/29/2023  Endocrinology follow-up note  Subjective:    Patient ID: Denise Oneill, female    DOB: 1961/08/25, PCP Richarda Blade E   Past Medical History:  Diagnosis Date   Asthma    Diabetes mellitus without complication (HCC)    Goiter    Hypertension    Hypothyroidism    Past Surgical History:  Procedure Laterality Date   COLONOSCOPY N/A 02/07/2018   Procedure: COLONOSCOPY;  Surgeon: Malissa Hippo, MD;  Location: [facility];  Service: Endoscopy;  Laterality: N/A;  830   Partial Thyroidiectomy     Social History   Socioeconomic History   Marital status: Married    Spouse name: Not on file   Number of children: Not on file   Years of education: Not on file   Highest education level: Not on file  Occupational History   Not on file  Tobacco Use   Smoking status: Never   Smokeless tobacco: Never  Vaping Use   Vaping status: Never Used  Substance and Sexual Activity   Alcohol use: No   Drug use: No   Sexual activity: Not on file  Other Topics Concern   Not on file  Social History Narrative   Not on file   Social Determinants of Health   Financial Resource Strain: Not on file  Food Insecurity: Not on file  Transportation Needs: Not on file  Physical Activity: Not on file  Stress: Not on file  Social Connections: Not on file   Outpatient Encounter Medications as of 05/29/2023  Medication Sig   anastrozole (ARIMIDEX) 1 MG tablet Take 1 mg by mouth daily.   Calcium Carbonate-Vit D-Min (CALCIUM 1200 PO) Take by mouth daily.   levothyroxine (SYNTHROID) 75 MCG tablet Take 1 tablet (75 mcg total) by mouth daily before breakfast.   lisinopril-hydrochlorothiazide (ZESTORETIC) 10-12.5 MG tablet TAKE 1 TABLET BY MOUTH EVERY DAY   metFORMIN (GLUCOPHAGE) 500 MG tablet Take 1 tablet (500 mg total) by mouth daily with breakfast.   rosuvastatin (CRESTOR) 10 MG tablet Take 1 tablet (10 mg total) by mouth daily.   Vitamin D, Ergocalciferol, (DRISDOL) 1.25 MG  (50000 UNIT) CAPS capsule Take 1 capsule (50,000 Units total) by mouth every 7 (seven) days.   [DISCONTINUED] albuterol (PROVENTIL HFA;VENTOLIN HFA) 108 (90 Base) MCG/ACT inhaler Inhale 2 puffs into the lungs every 6 (six) hours as needed for wheezing or shortness of breath.   albuterol (VENTOLIN HFA) 108 (90 Base) MCG/ACT inhaler Inhale 2 puffs into the lungs every 6 (six) hours as needed for wheezing or shortness of breath.   No facility-administered encounter medications on file as of 05/29/2023.   ALLERGIES: Allergies  Allergen Reactions   Penicillins Hives    Has patient had a PCN reaction causing immediate rash, facial/tongue/throat swelling, SOB or lightheadedness with hypotension: Yes Has patient had a PCN reaction causing severe rash involving mucus membranes or skin necrosis: No Has patient had a PCN reaction that required hospitalization: Yes Has patient had a PCN reaction occurring within the last 10 years: No If all of the above answers are "NO", then may proceed with Cephalosporin use.   Sugar-Protein-Starch     Artificial sweeteners: give her a headache    VACCINATION STATUS: Immunization History  Administered Date(s) Administered   Moderna Covid-19 Vaccine Bivalent Booster 78yrs & up 09/07/2021   Moderna Sars-Covid-2 Vaccination 11/22/2019, 12/20/2019, 09/17/2020, 04/01/2021    Diabetes She presents for her follow-up diabetic visit. She has type 2 diabetes mellitus.  Her disease course has been stable. Pertinent negatives for hypoglycemia include no nervousness/anxiousness or tremors. Associated symptoms include weight loss. Pertinent negatives for diabetes include no blurred vision, no chest pain, no fatigue, no polydipsia, no polyphagia and no polyuria. There are no hypoglycemic complications. Symptoms are stable. Diabetic complications include nephropathy. Risk factors for coronary artery disease include diabetes mellitus, dyslipidemia, obesity, hypertension and sedentary  lifestyle. Current diabetic treatment includes oral agent (monotherapy). She is compliant with treatment most of the time. Her weight is fluctuating minimally. She is following a generally healthy diet. When asked about meal planning, she reported none. She has not had a previous visit with a dietitian. She participates in exercise three times a week. (She presents today with no meter or logs to review.  She does not routinely monitor glucose due to monotherapy of Metformin only.  Her POCT A1c today is 6.9%, increasing slightly from previous visit but she does note she has has sinus issues going on 2 weeks for which she has been on medications. She denies any s/s of hypoglycemia. ) An ACE inhibitor/angiotensin II receptor blocker is being taken. She does not see a podiatrist.Eye exam is current.  Thyroid Problem Presents for follow-up (- Her history includes partial left hemithyroidectomy in 2000 for nodular goiter.) visit. Symptoms include weight loss. Patient reports no anxiety, cold intolerance, constipation, depressed mood, diarrhea, fatigue, heat intolerance, palpitations or tremors. The symptoms have been stable. Her past medical history is significant for diabetes and hyperlipidemia.  Hyperlipidemia This is a chronic problem. The current episode started more than 1 year ago. The problem is controlled. Recent lipid tests were reviewed and are normal. Exacerbating diseases include chronic renal disease, diabetes, hypothyroidism and obesity. Factors aggravating her hyperlipidemia include thiazides. Pertinent negatives include no chest pain. Current antihyperlipidemic treatment includes statins. The current treatment provides moderate improvement of lipids. There are no compliance problems.  Risk factors for coronary artery disease include diabetes mellitus, dyslipidemia, hypertension, obesity and a sedentary lifestyle.  Hypertension This is a chronic problem. The problem has been resolved since onset. The  problem is controlled. Pertinent negatives include no blurred vision, chest pain or palpitations. Agents associated with hypertension include thyroid hormones. Risk factors for coronary artery disease include diabetes mellitus, dyslipidemia, obesity and sedentary lifestyle. Past treatments include ACE inhibitors and diuretics. The current treatment provides moderate improvement. There are no compliance problems.  Hypertensive end-organ damage includes kidney disease. Identifiable causes of hypertension include chronic renal disease and a thyroid problem.    Review of systems  Constitutional: + steadily decreasing body weight,  current Body mass index is 33.45 kg/m. , no fatigue, no subjective hyperthermia, no subjective hypothermia Eyes: no blurry vision, no xerophthalmia ENT: no sore throat, no nodules palpated in throat, no dysphagia/odynophagia, no hoarseness Cardiovascular: no chest pain, no shortness of breath, no palpitations, no leg swelling Respiratory: no cough, no shortness of breath Gastrointestinal: no nausea/vomiting/diarrhea Musculoskeletal: no muscle/joint aches Skin: no rashes, no hyperemia Neurological: no tremors, no numbness, no tingling, no dizziness Psychiatric: no depression, no anxiety  Objective:    BP 117/71 (BP Location: Left Arm, Patient Position: Sitting, Cuff Size: Large)   Pulse 69   Ht 5\' 7"  (1.702 m)   Wt 213 lb 9.6 oz (96.9 kg)   BMI 33.45 kg/m   Wt Readings from Last 3 Encounters:  05/29/23 213 lb 9.6 oz (96.9 kg)  11/24/22 214 lb (97.1 kg)  05/26/22 217 lb 9.6 oz (98.7 kg)    BP Readings  from Last 3 Encounters:  05/29/23 117/71  11/24/22 132/86  05/26/22 123/82    Physical Exam- Limited  Constitutional:  Body mass index is 33.45 kg/m. , not in acute distress, normal state of mind Eyes:  EOMI, no exophthalmos Thyroid: No gross goiter, + Scar from partial thyroidectomy in the past Musculoskeletal: no gross deformities, strength intact in all  four extremities, no gross restriction of joint movements Skin:  no rashes, no hyperemia Neurological: no tremor with outstretched hands  Diabetic Foot Exam - Simple   No data filed     Recent Results (from the past 2160 hour(s))  T4, free     Status: None   Collection Time: 05/23/23  7:31 AM  Result Value Ref Range   Free T4 1.3 0.8 - 1.8 ng/dL  TSH     Status: None   Collection Time: 05/23/23  7:31 AM  Result Value Ref Range   TSH 1.54 0.40 - 4.50 mIU/L  Comprehensive metabolic panel     Status: Abnormal   Collection Time: 05/23/23  7:33 AM  Result Value Ref Range   Glucose, Bld 136 (H) 65 - 99 mg/dL    Comment: .            Fasting reference interval . For someone without known diabetes, a glucose value >125 mg/dL indicates that they may have diabetes and this should be confirmed with a follow-up test. .    BUN 15 7 - 25 mg/dL   Creat 1.61 0.96 - 0.45 mg/dL   BUN/Creatinine Ratio SEE NOTE: 6 - 22 (calc)    Comment:    Not Reported: BUN and Creatinine are within    reference range. .    Sodium 139 135 - 146 mmol/L   Potassium 4.4 3.5 - 5.3 mmol/L   Chloride 103 98 - 110 mmol/L   CO2 26 20 - 32 mmol/L   Calcium 10.0 8.6 - 10.4 mg/dL   Total Protein 6.8 6.1 - 8.1 g/dL   Albumin 4.2 3.6 - 5.1 g/dL   Globulin 2.6 1.9 - 3.7 g/dL (calc)   AG Ratio 1.6 1.0 - 2.5 (calc)   Total Bilirubin 0.4 0.2 - 1.2 mg/dL   Alkaline phosphatase (APISO) 66 37 - 153 U/L   AST 25 10 - 35 U/L   ALT 21 6 - 29 U/L  HgB A1c     Status: Abnormal   Collection Time: 05/29/23  1:54 PM  Result Value Ref Range   Hemoglobin A1C 6.5 (A) 4.0 - 5.6 %   HbA1c POC (<> result, manual entry)     HbA1c, POC (prediabetic range)     HbA1c, POC (controlled diabetic range)       Latest Reference Range & Units 07/08/20 10:45 11/13/20 07:30 05/17/21 07:41 11/15/21 08:31 11/18/22 08:06 05/23/23 07:31  TSH 0.40 - 4.50 mIU/L 0.92 0.90 1.53 1.59 0.950 1.54  T4,Free(Direct) 0.8 - 1.8 ng/dL 1.1 1.3 1.1 1.2  4.09 1.3    Assessment & Plan:   1) Hypothyroidism- postsurgical   -Her previsit thyroid function tests are consistent with appropriate hormone replacement.  She is advised to continue her current dose of Levothyroxine 75 mcg po daily before breakfast.   - We discussed about the correct intake of her thyroid hormone, on empty stomach at fasting, with water, separated by at least 30 minutes from breakfast and other medications,  and separated by more than 4 hours from calcium, iron, multivitamins, acid reflux medications (PPIs). -Patient is made aware of  the fact that thyroid hormone replacement is needed for life, dose to be adjusted by periodic monitoring of thyroid function tests.  2) Controlled type 2 diabetes mellitus  - This patient has significant metabolic syndrome including type 2 diabetes, dyslipidemia, hypertension, obesity, relative sedentary life.  She presents today with no meter or logs to review, she does not check glucose routinely due to monotherapy with Metformin only.  Her POCT A1c today is 6.5%, improving from last visit of 6.9%.  - Nutritional counseling repeated at each appointment due to patients tendency to fall back in to old habits.  - The patient admits there is a room for improvement in their diet and drink choices. -  Suggestion is made for the patient to avoid simple carbohydrates from their diet including Cakes, Sweet Desserts / Pastries, Ice Cream, Soda (diet and regular), Sweet Tea, Candies, Chips, Cookies, Sweet Pastries, Store Bought Juices, Alcohol in Excess of 1-2 drinks a day, Artificial Sweeteners, Coffee Creamer, and "Sugar-free" Products. This will help patient to have stable blood glucose profile and potentially avoid unintended weight gain.   - I encouraged the patient to switch to unprocessed or minimally processed complex starch and increased protein intake (animal or plant source), fruits, and vegetables.   - Patient is advised to stick to a  routine mealtimes to eat 3 meals a day and avoid unnecessary snacks (to snack only to correct hypoglycemia).  -Given her controlled glycemic profile, she is advised to continue Metformin 500 mg po daily with breakfast and continue working on diet and exercise.  3) Nodular goiter -Her thyroid ultrasound from 03/23/17 shows surgically absent left thyroid lobe and isthmus and mildly enlarged right thyroid lobe without discrete nodules. -No dedicated follow up needed at this time.  4) Hypertension:  Her blood pressure is controlled to target.  She is advised to continue Lisinopril-HCT 10-12.5 mg po daily with breakfast.  5) Hyperlipidemia:  Her most recent lipid panel from 11/18/22 shows controlled LDL of 96.  She is advised to continue Crestor 10 mg po daily at bedtime.  Side effects and precautions discussed with her.  Will recheck lipid panel prior to next visit.  6) Vitamin D deficiency: Her most recent vitamin D level was 72.1 on 11/18/22.  She is taking daily maintenance dose of Vitamin D3 5000 units PO daily OTC.  She is advised to continue per her other specialist.     - I advised patient to maintain close follow up with Richarda Blade E for primary care needs.      I spent  45  minutes in the care of the patient today including review of labs from CMP, Lipids, Thyroid Function, Hematology (current and previous including abstractions from other facilities); face-to-face time discussing  her blood glucose readings/logs, discussing hypoglycemia and hyperglycemia episodes and symptoms, medications doses, her options of short and long term treatment based on the latest standards of care / guidelines;  discussion about incorporating lifestyle medicine;  and documenting the encounter. Risk reduction counseling performed per USPSTF guidelines to reduce obesity and cardiovascular risk factors.     Please refer to Patient Instructions for Blood Glucose Monitoring and Insulin/Medications Dosing Guide"   in media tab for additional information. Please  also refer to " Patient Self Inventory" in the Media  tab for reviewed elements of pertinent patient history.  Denise Oneill participated in the discussions, expressed understanding, and voiced agreement with the above plans.  All questions were answered to her satisfaction. she is  encouraged to contact clinic should she have any questions or concerns prior to her return visit.    Follow up plan: Return in about 6 months (around 11/26/2023) for Diabetes F/U with A1c in office, Thyroid follow up, Previsit labs.  Ronny Bacon, [facility] [address] [place], [place] [address] Phone: [phone] Fax: [phone]  05/29/2023, 2:09 PM

## 2023-07-10 ENCOUNTER — Other Ambulatory Visit: Payer: Self-pay | Admitting: Nurse Practitioner

## 2023-10-11 ENCOUNTER — Other Ambulatory Visit: Payer: Self-pay | Admitting: Nurse Practitioner

## 2023-11-01 LAB — LAB REPORT - SCANNED
A1c: 6.9
Albumin, Urine POC: 1.3
EGFR: 75
Free T4: 1.3 ng/dL
TSH: 0.78 (ref 0.41–5.90)

## 2023-11-27 ENCOUNTER — Ambulatory Visit: Payer: BC Managed Care – PPO | Admitting: Nurse Practitioner

## 2023-11-27 ENCOUNTER — Encounter: Payer: Self-pay | Admitting: Nurse Practitioner

## 2023-11-27 VITALS — BP 122/74 | HR 89 | Ht 67.0 in | Wt 213.6 lb

## 2023-11-27 DIAGNOSIS — Z7984 Long term (current) use of oral hypoglycemic drugs: Secondary | ICD-10-CM

## 2023-11-27 DIAGNOSIS — E119 Type 2 diabetes mellitus without complications: Secondary | ICD-10-CM | POA: Diagnosis not present

## 2023-11-27 DIAGNOSIS — E559 Vitamin D deficiency, unspecified: Secondary | ICD-10-CM | POA: Diagnosis not present

## 2023-11-27 DIAGNOSIS — E039 Hypothyroidism, unspecified: Secondary | ICD-10-CM

## 2023-11-27 DIAGNOSIS — E782 Mixed hyperlipidemia: Secondary | ICD-10-CM

## 2023-11-27 DIAGNOSIS — I10 Essential (primary) hypertension: Secondary | ICD-10-CM

## 2023-11-27 MED ORDER — METFORMIN HCL 500 MG PO TABS: 500.0000 mg | ORAL_TABLET | Freq: Every day | ORAL | 3 refills | Status: AC

## 2023-11-27 NOTE — Patient Instructions (Signed)

## 2023-11-27 NOTE — Progress Notes (Signed)
 11/27/2023  Endocrinology follow-up note  Subjective:    Patient ID: Denise Oneill, female    DOB: 26-Aug-1961, PCP Richarda Blade E   Past Medical History:  Diagnosis Date   Asthma    Diabetes mellitus without complication (HCC)    Goiter    Hypertension    Hypothyroidism    Past Surgical History:  Procedure Laterality Date   COLONOSCOPY N/A 02/07/2018   Procedure: COLONOSCOPY;  Surgeon: Malissa Hippo, MD;  Location: [facility];  Service: Endoscopy;  Laterality: N/A;  830   Partial Thyroidiectomy     Social History   Socioeconomic History   Marital status: Married    Spouse name: Not on file   Number of children: Not on file   Years of education: Not on file   Highest education level: Not on file  Occupational History   Not on file  Tobacco Use   Smoking status: Never   Smokeless tobacco: Never  Vaping Use   Vaping status: Never Used  Substance and Sexual Activity   Alcohol use: No   Drug use: No   Sexual activity: Not on file  Other Topics Concern   Not on file  Social History Narrative   Not on file   Social Drivers of Health   Financial Resource Strain: Not on file  Food Insecurity: Not on file  Transportation Needs: Not on file  Physical Activity: Not on file  Stress: Not on file  Social Connections: Not on file   Outpatient Encounter Medications as of 11/27/2023  Medication Sig   albuterol (VENTOLIN HFA) 108 (90 Base) MCG/ACT inhaler Inhale 2 puffs into the lungs every 6 (six) hours as needed for wheezing or shortness of breath.   anastrozole (ARIMIDEX) 1 MG tablet Take 1 mg by mouth daily.   Calcium Carbonate-Vit D-Min (CALCIUM 1200 PO) Take by mouth daily.   levothyroxine (SYNTHROID) 75 MCG tablet TAKE 1 TABLET BY MOUTH DAILY BEFORE BREAKFAST.   lisinopril-hydrochlorothiazide (ZESTORETIC) 10-12.5 MG tablet TAKE 1 TABLET BY MOUTH EVERY DAY   rosuvastatin (CRESTOR) 10 MG tablet Take 1 tablet (10 mg total) by mouth daily.   Vitamin D,  Ergocalciferol, (DRISDOL) 1.25 MG (50000 UNIT) CAPS capsule Take 1 capsule (50,000 Units total) by mouth every 7 (seven) days.   [DISCONTINUED] metFORMIN (GLUCOPHAGE) 500 MG tablet Take 1 tablet (500 mg total) by mouth daily with breakfast.   metFORMIN (GLUCOPHAGE) 500 MG tablet Take 1 tablet (500 mg total) by mouth daily with breakfast.   No facility-administered encounter medications on file as of 11/27/2023.   ALLERGIES: Allergies  Allergen Reactions   Penicillins Hives    Has patient had a PCN reaction causing immediate rash, facial/tongue/throat swelling, SOB or lightheadedness with hypotension: Yes Has patient had a PCN reaction causing severe rash involving mucus membranes or skin necrosis: No Has patient had a PCN reaction that required hospitalization: Yes Has patient had a PCN reaction occurring within the last 10 years: No If all of the above answers are "NO", then may proceed with Cephalosporin use.   Sugar-Protein-Starch     Artificial sweeteners: give her a headache    VACCINATION STATUS: Immunization History  Administered Date(s) Administered   Moderna Covid-19 Vaccine Bivalent Booster 44yrs & up 09/07/2021   Moderna Sars-Covid-2 Vaccination 11/22/2019, 12/20/2019, 09/17/2020, 04/01/2021    Diabetes She presents for her follow-up diabetic visit. She has type 2 diabetes mellitus. Her disease course has been stable. Pertinent negatives for hypoglycemia include no nervousness/anxiousness or  tremors. Pertinent negatives for diabetes include no blurred vision, no chest pain, no fatigue, no polydipsia, no polyphagia, no polyuria and no weight loss. There are no hypoglycemic complications. Symptoms are stable. Diabetic complications include nephropathy. Risk factors for coronary artery disease include diabetes mellitus, dyslipidemia, obesity, hypertension and sedentary lifestyle. Current diabetic treatment includes oral agent (monotherapy). She is compliant with treatment most of  the time. Her weight is fluctuating minimally. She is following a generally healthy diet. When asked about meal planning, she reported none. She has not had a previous visit with a dietitian. She participates in exercise three times a week. (She presents today with no meter or logs to review.  She does not routinely monitor glucose due to monotherapy of Metformin only.  Her previsit A1c on 2/12 was 6.9%, increasing slightly from previous visit of 6.5%. She denies any s/s of hypoglycemia. ) An ACE inhibitor/angiotensin II receptor blocker is being taken. She does not see a podiatrist.Eye exam is current.  Thyroid Problem Presents for follow-up (- Her history includes partial left hemithyroidectomy in 2000 for nodular goiter.) visit. Patient reports no anxiety, cold intolerance, constipation, depressed mood, diarrhea, fatigue, heat intolerance, palpitations, tremors or weight loss. The symptoms have been stable. Her past medical history is significant for diabetes.  Hyperlipidemia This is a chronic problem. The current episode started more than 1 year ago. The problem is controlled. Recent lipid tests were reviewed and are normal. Exacerbating diseases include chronic renal disease, diabetes, hypothyroidism and obesity. Factors aggravating her hyperlipidemia include thiazides. Pertinent negatives include no chest pain. Current antihyperlipidemic treatment includes statins. The current treatment provides moderate improvement of lipids. There are no compliance problems.  Risk factors for coronary artery disease include diabetes mellitus, dyslipidemia, hypertension, obesity and a sedentary lifestyle.  Hypertension This is a chronic problem. The problem has been resolved since onset. The problem is controlled. Pertinent negatives include no blurred vision, chest pain or palpitations. Agents associated with hypertension include thyroid hormones. Risk factors for coronary artery disease include diabetes mellitus,  dyslipidemia, obesity and sedentary lifestyle. Past treatments include ACE inhibitors and diuretics. The current treatment provides moderate improvement. There are no compliance problems.  Hypertensive end-organ damage includes kidney disease. Identifiable causes of hypertension include chronic renal disease and a thyroid problem.    Review of systems  Constitutional: + stable body weight,  current Body mass index is 33.45 kg/m. , no fatigue, no subjective hyperthermia, no subjective hypothermia Eyes: no blurry vision, no xerophthalmia ENT: no sore throat, no nodules palpated in throat, no dysphagia/odynophagia, no hoarseness Cardiovascular: no chest pain, no shortness of breath, no palpitations, no leg swelling Respiratory: no cough, no shortness of breath Gastrointestinal: no nausea/vomiting/diarrhea Musculoskeletal: no muscle/joint aches Skin: no rashes, no hyperemia Neurological: no tremors, no numbness, no tingling, no dizziness Psychiatric: no depression, no anxiety  Objective:    BP 122/74 (BP Location: Left Arm, Patient Position: Sitting, Cuff Size: Large)   Pulse 89   Ht 5\' 7"  (1.702 m)   Wt 213 lb 9.6 oz (96.9 kg)   BMI 33.45 kg/m   Wt Readings from Last 3 Encounters:  11/27/23 213 lb 9.6 oz (96.9 kg)  05/29/23 213 lb 9.6 oz (96.9 kg)  11/24/22 214 lb (97.1 kg)    BP Readings from Last 3 Encounters:  11/27/23 122/74  05/29/23 117/71  11/24/22 132/86    Physical Exam- Limited  Constitutional:  Body mass index is 33.45 kg/m. , not in acute distress, normal state of mind Eyes:  EOMI, no exophthalmos Thyroid: No gross goiter, + Scar from partial thyroidectomy in the past Musculoskeletal: no gross deformities, strength intact in all four extremities, no gross restriction of joint movements Skin:  no rashes, no hyperemia Neurological: no tremor with outstretched hands  Diabetic Foot Exam - Simple   No data filed     Recent Results (from the past 2160 hours)   Lab report - scanned     Status: None   Collection Time: 11/01/23 11:19 AM  Result Value Ref Range   A1c 6.9     Comment: ABSTRACTED BY HIM   TSH 0.78 0.41 - 5.90    Comment: ABSTRACTED BY HIM   Free T4 1.30 ng/dL    Comment: ABSTRACTED BY HIM   EGFR 75.0     Comment: ABSTRACTED BY HIM   Albumin, Urine POC 1.3     Comment: ABSTRACTED BY HIM     Latest Reference Range & Units 11/18/22 08:06 05/23/23 07:31 11/01/23 11:19  TSH 0.41 - 5.90  0.950 1.54 0.78 (E)  T4,Free(Direct) ng/dL 1.61 1.3 0.96 (E)  (E): External lab result   Assessment & Plan:   1) Hypothyroidism- postsurgical -Her previsit thyroid function tests are consistent with appropriate hormone replacement.  She is advised to continue her current dose of Levothyroxine 75 mcg po daily before breakfast.   - We discussed about the correct intake of her thyroid hormone, on empty stomach at fasting, with water, separated by at least 30 minutes from breakfast and other medications,  and separated by more than 4 hours from calcium, iron, multivitamins, acid reflux medications (PPIs). -Patient is made aware of the fact that thyroid hormone replacement is needed for life, dose to be adjusted by periodic monitoring of thyroid function tests.  2) Controlled type 2 diabetes mellitus  - This patient has significant metabolic syndrome including type 2 diabetes, dyslipidemia, hypertension, obesity, relative sedentary life.  She presents today with no meter or logs to review.  She does not routinely monitor glucose due to monotherapy of Metformin only.  Her previsit A1c on 2/12 was 6.9%, increasing slightly from previous visit of 6.5%. She denies any s/s of hypoglycemia.   - Nutritional counseling repeated at each appointment due to patients tendency to fall back in to old habits.  - The patient admits there is a room for improvement in their diet and drink choices. -  Suggestion is made for the patient to avoid simple carbohydrates  from their diet including Cakes, Sweet Desserts / Pastries, Ice Cream, Soda (diet and regular), Sweet Tea, Candies, Chips, Cookies, Sweet Pastries, Store Bought Juices, Alcohol in Excess of 1-2 drinks a day, Artificial Sweeteners, Coffee Creamer, and "Sugar-free" Products. This will help patient to have stable blood glucose profile and potentially avoid unintended weight gain.   - I encouraged the patient to switch to unprocessed or minimally processed complex starch and increased protein intake (animal or plant source), fruits, and vegetables.   - Patient is advised to stick to a routine mealtimes to eat 3 meals a day and avoid unnecessary snacks (to snack only to correct hypoglycemia).  -Given her controlled glycemic profile, she is advised to continue Metformin 500 mg po daily with breakfast and continue working on diet and exercise.  3) Nodular goiter -Her thyroid ultrasound from 11/01/23 shows surgically absent left thyroid lobe and isthmus and mildly enlarged right thyroid lobe without discrete nodules. -No dedicated follow up needed at this time.  4) Hypertension:  Her blood pressure is  controlled to target.  She is advised to continue Lisinopril-HCT 10-12.5 mg po daily with breakfast.  5) Hyperlipidemia:  Her most recent lipid panel from 11/01/23 shows controlled LDL of 96.  She is advised to continue Crestor 10 mg po daily at bedtime.  Side effects and precautions discussed with her.    6) Vitamin D deficiency: Her most recent vitamin D level was 72.1 on 11/18/22.  She is taking daily maintenance dose of Vitamin D3 5000 units PO daily OTC.  She is advised to continue per her other specialist.     - I advised patient to maintain close follow up with Richarda Blade E for primary care needs.      I spent  25  minutes in the care of the patient today including review of labs from CMP, Lipids, Thyroid Function, Hematology (current and previous including abstractions from other  facilities); face-to-face time discussing  her blood glucose readings/logs, discussing hypoglycemia and hyperglycemia episodes and symptoms, medications doses, her options of short and long term treatment based on the latest standards of care / guidelines;  discussion about incorporating lifestyle medicine;  and documenting the encounter. Risk reduction counseling performed per USPSTF guidelines to reduce obesity and cardiovascular risk factors.     Please refer to Patient Instructions for Blood Glucose Monitoring and Insulin/Medications Dosing Guide"  in media tab for additional information. Please  also refer to " Patient Self Inventory" in the Media  tab for reviewed elements of pertinent patient history.  Denise Oneill participated in the discussions, expressed understanding, and voiced agreement with the above plans.  All questions were answered to her satisfaction. she is encouraged to contact clinic should she have any questions or concerns prior to her return visit.    Follow up plan: Return in about 6 months (around 05/29/2024) for Diabetes F/U with A1c in office, No previsit labs.  Ronny Bacon, [facility] [address] [place], [place] [address] Phone: [phone] Fax: [phone]  11/27/2023, 1:47 PM

## 2024-01-08 ENCOUNTER — Other Ambulatory Visit: Payer: Self-pay | Admitting: Nurse Practitioner

## 2024-04-07 ENCOUNTER — Other Ambulatory Visit: Payer: Self-pay | Admitting: Nurse Practitioner

## 2024-05-29 ENCOUNTER — Encounter: Payer: Self-pay | Admitting: Nurse Practitioner

## 2024-05-29 ENCOUNTER — Ambulatory Visit (INDEPENDENT_AMBULATORY_CARE_PROVIDER_SITE_OTHER): Admitting: Nurse Practitioner

## 2024-05-29 DIAGNOSIS — E039 Hypothyroidism, unspecified: Secondary | ICD-10-CM

## 2024-05-29 DIAGNOSIS — I10 Essential (primary) hypertension: Secondary | ICD-10-CM | POA: Diagnosis not present

## 2024-05-29 DIAGNOSIS — Z7984 Long term (current) use of oral hypoglycemic drugs: Secondary | ICD-10-CM

## 2024-05-29 DIAGNOSIS — E119 Type 2 diabetes mellitus without complications: Secondary | ICD-10-CM

## 2024-05-29 DIAGNOSIS — E559 Vitamin D deficiency, unspecified: Secondary | ICD-10-CM

## 2024-05-29 DIAGNOSIS — E782 Mixed hyperlipidemia: Secondary | ICD-10-CM

## 2024-05-29 LAB — POCT GLYCOSYLATED HEMOGLOBIN (HGB A1C): Hemoglobin A1C: 6.4 % — AB (ref 4.0–5.6)

## 2024-05-29 LAB — POCT UA - MICROALBUMIN: Albumin/Creatinine Ratio, Urine, POC: <30

## 2024-05-29 MED ORDER — LEVOTHYROXINE SODIUM 75 MCG PO TABS: 75.0000 ug | ORAL_TABLET | Freq: Every day | ORAL | 3 refills | Status: AC

## 2024-05-29 NOTE — Progress Notes (Signed)
 05/29/2024  Endocrinology follow-up note  Subjective:    Patient ID: Denise Oneill, female    DOB: 23-Dec-1960, PCP Viktoria Clunes E   Past Medical History:  Diagnosis Date   Asthma    Diabetes mellitus without complication (HCC)    Goiter    Hypertension    Hypothyroidism    Past Surgical History:  Procedure Laterality Date   COLONOSCOPY N/A 02/07/2018   Procedure: COLONOSCOPY;  Surgeon: Golda Claudis PENNER, MD;  Location: [facility];  Service: Endoscopy;  Laterality: N/A;  830   Partial Thyroidiectomy     Social History   Socioeconomic History   Marital status: Married    Spouse name: Not on file   Number of children: Not on file   Years of education: Not on file   Highest education level: Not on file  Occupational History   Not on file  Tobacco Use   Smoking status: Never   Smokeless tobacco: Never  Vaping Use   Vaping status: Never Used  Substance and Sexual Activity   Alcohol use: No   Drug use: No   Sexual activity: Not on file  Other Topics Concern   Not on file  Social History Narrative   Not on file   Social Drivers of Health   Financial Resource Strain: Not on file  Food Insecurity: Not on file  Transportation Needs: Not on file  Physical Activity: Not on file  Stress: Not on file  Social Connections: Not on file   Outpatient Encounter Medications as of 05/29/2024  Medication Sig   albuterol  (VENTOLIN  HFA) 108 (90 Base) MCG/ACT inhaler Inhale 2 puffs into the lungs every 6 (six) hours as needed for wheezing or shortness of breath.   anastrozole (ARIMIDEX) 1 MG tablet Take 1 mg by mouth daily.   Calcium  Carbonate-Vit D-Min (CALCIUM  1200 PO) Take by mouth daily.   levothyroxine  (SYNTHROID ) 75 MCG tablet Take 1 tablet (75 mcg total) by mouth daily before breakfast.   lisinopril -hydrochlorothiazide  (ZESTORETIC ) 10-12.5 MG tablet TAKE 1 TABLET BY MOUTH EVERY DAY   metFORMIN  (GLUCOPHAGE ) 500 MG tablet Take 1 tablet (500 mg total) by mouth  daily with breakfast.   rosuvastatin  (CRESTOR ) 10 MG tablet TAKE 1 TABLET BY MOUTH EVERY DAY   Vitamin D , Ergocalciferol , (DRISDOL ) 1.25 MG (50000 UNIT) CAPS capsule Take 1 capsule (50,000 Units total) by mouth every 7 (seven) days.   [DISCONTINUED] levothyroxine  (SYNTHROID ) 75 MCG tablet TAKE 1 TABLET BY MOUTH DAILY BEFORE BREAKFAST.   No facility-administered encounter medications on file as of 05/29/2024.   ALLERGIES: Allergies  Allergen Reactions   Penicillins Hives    Has patient had a PCN reaction causing immediate rash, facial/tongue/throat swelling, SOB or lightheadedness with hypotension: Yes Has patient had a PCN reaction causing severe rash involving mucus membranes or skin necrosis: No Has patient had a PCN reaction that required hospitalization: Yes Has patient had a PCN reaction occurring within the last 10 years: No If all of the above answers are NO, then may proceed with Cephalosporin use.   Sugar-Protein-Starch     Artificial sweeteners: give her a headache    VACCINATION STATUS: Immunization History  Administered Date(s) Administered   Moderna Covid-19 Vaccine Bivalent Booster 79yrs & up 09/07/2021   Moderna Sars-Covid-2 Vaccination 11/22/2019, 12/20/2019, 09/17/2020, 04/01/2021    Diabetes She presents for her follow-up diabetic visit. She has type 2 diabetes mellitus. Her disease course has been improving. Pertinent negatives for hypoglycemia include no nervousness/anxiousness or tremors. Pertinent  negatives for diabetes include no fatigue, no polydipsia, no polyphagia, no polyuria and no weight loss. There are no hypoglycemic complications. Symptoms are stable. Diabetic complications include nephropathy. Risk factors for coronary artery disease include diabetes mellitus, dyslipidemia, obesity, hypertension and sedentary lifestyle. Current diabetic treatment includes oral agent (monotherapy). She is compliant with treatment most of the time. Her weight is fluctuating  minimally. She is following a generally healthy diet. When asked about meal planning, she reported none. She has not had a previous visit with a dietitian. She participates in exercise three times a week. (She presents today with no meter or logs to review.  She does not routinely monitor glucose due to monotherapy of Metformin  only.  Her POCT A1c today is 6.4% improving from last visit of 6.9%. ) An ACE inhibitor/angiotensin II receptor blocker is being taken. She does not see a podiatrist.Eye exam is current.  Thyroid  Problem Presents for follow-up (- Her history includes partial left hemithyroidectomy in 2000 for nodular goiter.) visit. Patient reports no anxiety, cold intolerance, constipation, depressed mood, diarrhea, fatigue, heat intolerance, tremors or weight loss. The symptoms have been stable. Her past medical history is significant for hyperlipidemia.    Review of systems  Constitutional: + Minimally fluctuating body weight,  current There is no height or weight on file to calculate BMI. , no fatigue, no subjective hyperthermia, no subjective hypothermia Eyes: no blurry vision, no xerophthalmia ENT: no sore throat, no nodules palpated in throat, no dysphagia/odynophagia, no hoarseness Cardiovascular: no chest pain, no shortness of breath, no palpitations, no leg swelling Respiratory: no cough, no shortness of breath Gastrointestinal: no nausea/vomiting/diarrhea Musculoskeletal: no muscle/joint aches Skin: no rashes, no hyperemia Neurological: no tremors, no numbness, no tingling, no dizziness Psychiatric: no depression, no anxiety  Objective:    There were no vitals taken for this visit.  Wt Readings from Last 3 Encounters:  11/27/23 213 lb 9.6 oz (96.9 kg)  05/29/23 213 lb 9.6 oz (96.9 kg)  11/24/22 214 lb (97.1 kg)    BP Readings from Last 3 Encounters:  11/27/23 122/74  05/29/23 117/71  11/24/22 132/86    Physical Exam- Limited  Constitutional:  There is no height or  weight on file to calculate BMI. , not in acute distress, normal state of mind Eyes:  EOMI, no exophthalmos Thyroid : No gross goiter, + Scar from partial thyroidectomy in the past Musculoskeletal: no gross deformities, strength intact in all four extremities, no gross restriction of joint movements Skin:  no rashes, no hyperemia Neurological: no tremor with outstretched hands  Diabetic Foot Exam - Simple   Simple Foot Form Diabetic Foot exam was performed with the following findings: Yes 05/29/2024  1:31 PM  Visual Inspection No deformities, no ulcerations, no other skin breakdown bilaterally: Yes Sensation Testing Intact to touch and monofilament testing bilaterally: Yes Pulse Check Posterior Tibialis and Dorsalis pulse intact bilaterally: Yes Comments Missing toenails 2-5 bilaterally     Recent Results (from the past 2160 hours)  POCT UA - Microalbumin     Status: Normal   Collection Time: 05/29/24  2:08 PM  Result Value Ref Range   Microalbumin Ur, POC 30mg /L mg/L   Creatinine, POC 300mg /dL mg/dL   Albumin/Creatinine Ratio, Urine, POC <30 mg/G   HgB A1c     Status: Abnormal   Collection Time: 05/29/24  2:09 PM  Result Value Ref Range   Hemoglobin A1C 6.4 (A) 4.0 - 5.6 %   HbA1c POC (<> result, manual entry)  HbA1c, POC (prediabetic range)     HbA1c, POC (controlled diabetic range)        Latest Reference Range & Units 11/18/22 08:06 05/23/23 07:31 11/01/23 11:19  TSH 0.41 - 5.90  0.950 1.54 0.78 (E)  T4,Free(Direct) ng/dL 8.47 1.3 8.69 (E)  (E): External lab result   Assessment & Plan:   1) Hypothyroidism- postsurgical -There are no recent TFTs to review.  She is advised to continue her current dose of Levothyroxine  75 mcg po daily before breakfast.  Will recheck prior to next visit and adjust dose accordingly.   - We discussed about the correct intake of her thyroid  hormone, on empty stomach at fasting, with water , separated by at least 30 minutes from breakfast  and other medications,  and separated by more than 4 hours from calcium , iron, multivitamins, acid reflux medications (PPIs). -Patient is made aware of the fact that thyroid  hormone replacement is needed for life, dose to be adjusted by periodic monitoring of thyroid  function tests.  2) Controlled type 2 diabetes mellitus  - This patient has significant metabolic syndrome including type 2 diabetes, dyslipidemia, hypertension, obesity, relative sedentary life.  She presents today with no meter or logs to review.  She does not routinely monitor glucose due to monotherapy of Metformin  only.  Her POCT A1c today is 6.4% improving from last visit of 6.9%.   - Nutritional counseling repeated at each appointment due to patients tendency to fall back in to old habits.  - The patient admits there is a room for improvement in their diet and drink choices. -  Suggestion is made for the patient to avoid simple carbohydrates from their diet including Cakes, Sweet Desserts / Pastries, Ice Cream, Soda (diet and regular), Sweet Tea, Candies, Chips, Cookies, Sweet Pastries, Store Bought Juices, Alcohol in Excess of 1-2 drinks a day, Artificial Sweeteners, Coffee Creamer, and Sugar-free Products. This will help patient to have stable blood glucose profile and potentially avoid unintended weight gain.   - I encouraged the patient to switch to unprocessed or minimally processed complex starch and increased protein intake (animal or plant source), fruits, and vegetables.   - Patient is advised to stick to a routine mealtimes to eat 3 meals a day and avoid unnecessary snacks (to snack only to correct hypoglycemia).  -Given her controlled glycemic profile, she is advised to continue Metformin  500 mg po daily with breakfast and continue working on diet and exercise.  3) Nodular goiter -Her thyroid  ultrasound from 11/01/23 shows surgically absent left thyroid  lobe and isthmus and mildly enlarged right thyroid  lobe  without discrete nodules. -No dedicated follow up needed at this time.  4) Hypertension:  Her blood pressure is controlled to target.  She is advised to continue meds as prescribed by PCP.  5) Hyperlipidemia:  Her most recent lipid panel from 11/01/23 shows controlled LDL of 96.  She is advised to continue Crestor  10 mg po daily at bedtime.  Side effects and precautions discussed with her.  Will recheck prior to next visit.  6) Vitamin D  deficiency: Her most recent vitamin D  level was 72.1 on 11/18/22.  She is taking daily maintenance dose of Vitamin D3 5000 units PO daily OTC.  She is advised to continue per her other specialist.     - I advised patient to maintain close follow up with Elliott, Dianne E for primary care needs.     I spent  43  minutes in the care of the patient today including review  of labs from CMP, Lipids, Thyroid  Function, Hematology (current and previous including abstractions from other facilities); face-to-face time discussing  her blood glucose readings/logs, discussing hypoglycemia and hyperglycemia episodes and symptoms, medications doses, her options of short and long term treatment based on the latest standards of care / guidelines;  discussion about incorporating lifestyle medicine;  and documenting the encounter. Risk reduction counseling performed per USPSTF guidelines to reduce obesity and cardiovascular risk factors.     Please refer to Patient Instructions for Blood Glucose Monitoring and Insulin/Medications Dosing Guide  in media tab for additional information. Please  also refer to  Patient Self Inventory in the Media  tab for reviewed elements of pertinent patient history.  Denise Oneill participated in the discussions, expressed understanding, and voiced agreement with the above plans.  All questions were answered to her satisfaction. she is encouraged to contact clinic should she have any questions or concerns prior to her return  visit.    Follow up plan: Return in about 6 months (around 11/26/2024) for Diabetes F/U with A1c in office, Thyroid  follow up, Previsit labs.  Benton Rio, [facility] [address] [place], [place] [address] Phone: [phone] Fax: [phone]  05/29/2024, 3:04 PM

## 2024-07-17 ENCOUNTER — Other Ambulatory Visit: Payer: Self-pay | Admitting: Nurse Practitioner

## 2024-07-23 ENCOUNTER — Other Ambulatory Visit: Payer: Self-pay | Admitting: Nurse Practitioner

## 2024-10-13 ENCOUNTER — Other Ambulatory Visit: Payer: Self-pay | Admitting: Nurse Practitioner

## 2024-11-26 ENCOUNTER — Ambulatory Visit: Admitting: Nurse Practitioner
